# Patient Record
Sex: Female | Born: 1980 | Race: White | Hispanic: No | Marital: Married | Smoking: Former smoker
Health system: Southern US, Community
[De-identification: ages and names within clinical notes are randomized; demographics above are authoritative.]

## PROBLEM LIST (undated history)

## (undated) DIAGNOSIS — K219 Gastro-esophageal reflux disease without esophagitis: Secondary | ICD-10-CM

## (undated) DIAGNOSIS — Q2381 Bicuspid aortic valve: Secondary | ICD-10-CM

## (undated) DIAGNOSIS — Q231 Congenital insufficiency of aortic valve: Secondary | ICD-10-CM

## (undated) HISTORY — DX: Gastro-esophageal reflux disease without esophagitis: K21.9

---

## 2009-04-10 ENCOUNTER — Emergency Department (HOSPITAL_COMMUNITY): Admission: EM | Admit: 2009-04-10 | Discharge: 2009-04-10 | Payer: Self-pay | Admitting: Family Medicine

## 2009-09-30 ENCOUNTER — Emergency Department (HOSPITAL_COMMUNITY): Admission: EM | Admit: 2009-09-30 | Discharge: 2009-09-30 | Payer: Self-pay | Admitting: Family Medicine

## 2011-03-09 LAB — CULTURE, ROUTINE-ABSCESS

## 2014-09-30 ENCOUNTER — Other Ambulatory Visit: Payer: Self-pay | Admitting: Obstetrics & Gynecology

## 2014-09-30 ENCOUNTER — Other Ambulatory Visit (HOSPITAL_COMMUNITY)
Admission: RE | Admit: 2014-09-30 | Discharge: 2014-09-30 | Disposition: A | Discharge status: Home / Self Care | Payer: BC Managed Care – PPO | Source: Ambulatory Visit | Attending: Obstetrics & Gynecology | Admitting: Obstetrics & Gynecology

## 2014-09-30 DIAGNOSIS — Z1151 Encounter for screening for human papillomavirus (HPV): Secondary | ICD-10-CM | POA: Insufficient documentation

## 2014-09-30 DIAGNOSIS — Z01419 Encounter for gynecological examination (general) (routine) without abnormal findings: Secondary | ICD-10-CM | POA: Insufficient documentation

## 2014-10-01 LAB — CYTOLOGY - PAP

## 2015-11-30 HISTORY — PX: DIAGNOSTIC LAPAROSCOPY WITH REMOVAL OF ECTOPIC PREGNANCY: SHX6449

## 2018-01-13 ENCOUNTER — Ambulatory Visit (INDEPENDENT_AMBULATORY_CARE_PROVIDER_SITE_OTHER): Payer: Managed Care, Other (non HMO) | Admitting: Cardiology

## 2018-01-13 ENCOUNTER — Encounter: Payer: Self-pay | Admitting: Cardiology

## 2018-01-13 DIAGNOSIS — E785 Hyperlipidemia, unspecified: Secondary | ICD-10-CM

## 2018-01-13 DIAGNOSIS — R011 Cardiac murmur, unspecified: Secondary | ICD-10-CM

## 2018-01-13 NOTE — Progress Notes (Signed)
Cardiology Consultation:    Date:  01/13/2018   ID:  [name], DOB 1981-11-13, MRN [id number]  PCP:  Patient, No Pcp Per  Cardiologist:  Gypsy Balsam, MD   Referring MD: No ref. provider found   Chief Complaint  Patient presents with  . Heart Murmur  I have heart murmur  History of Present Illness:    Lisa Lewis is a 37 y.o. female who is being seen today for the evaluation of heart murmur at the request of No ref. provider found.  She was referred to [place] by gynecologist.  Apparently she is trying to get pregnant she had one miscarriage and one ectopic pregnancy.  During examination she was find to have heart murmur and she was referred to [place].  She was never aware of having any heart murmur.  There is no heart trouble.  She does not have hypertension no diabetes apparently her cholesterol is mildly elevated but otherwise she seems to be doing well.  There is no family history of heart disease.  She does not have any exercise intolerance.  She used to exercise on the regular basis however lately she is lack of and she is not doing it however just few days ago she tried to exercise again she did quite well.  There is no excessive shortness of breath, there is no swelling of lower extremities there is no palpitations no dizziness no passing out however couple days ago she was working very hard she did actually 30 steps that they already and she did not it did not doing very well and for a moment she failed dizziness she ended up drinking some water after that and she was fine.  Past Medical History:  Diagnosis Date  . Acid reflux       Current Medications: Current Meds  Medication Sig  . omeprazole (PRILOSEC) 20 MG capsule Take 20 mg by mouth daily.     Allergies:   Patient has no known allergies.   Social History   Socioeconomic History  . Marital status: Single    Spouse name: None  . Number of children: None  . Years of education: None  . Highest education level:  None  Social Needs  . Financial resource strain: None  . Food insecurity - worry: None  . Food insecurity - inability: None  . Transportation needs - medical: None  . Transportation needs - non-medical: None  Occupational History  . None  Tobacco Use  . Smoking status: Former Games developer  . Smokeless tobacco: Never Used  Substance and Sexual Activity  . Alcohol use: No    Frequency: Never  . Drug use: No  . Sexual activity: None  Other Topics Concern  . None  Social History Narrative  . None     Family History: The patient's family history is not on file. ROS:   Please see the history of present illness.    All 14 point review of systems negative except as described per history of present illness.  EKGs/Labs/Other Studies Reviewed:    The following studies were reviewed today:   EKG:  EKG is  ordered today.  The ekg ordered today demonstrates EKG showed normal sinus rhythm normal P interval normal QS complex duration morphology low voltage no ST segment abnormalities  Recent Labs: No results found for requested labs within last 8760 hours.  Recent Lipid Panel No results found for: CHOL, TRIG, HDL, CHOLHDL, VLDL, LDLCALC, LDLDIRECT  Physical Exam:    VS:  BP 110/62   Pulse 84   Ht 5\' 1"  (1.549 m)   Wt 223 lb 12.8 oz (101.5 kg)   SpO2 98%   BMI 42.29 kg/m     Wt Readings from Last 3 Encounters:  01/13/18 223 lb 12.8 oz (101.5 kg)     GEN:  Well nourished, well developed in no acute distress HEENT: Normal NECK: No JVD; No carotid bruits LYMPHATICS: No lymphadenopathy CARDIAC: RRR, soft systolic murmur grade 1/6 best heard at the left border of the sternum without radiation, no rubs, no gallops RESPIRATORY:  Clear to auscultation without rales, wheezing or rhonchi  ABDOMEN: Soft, non-tender, non-distended MUSCULOSKELETAL:  No edema; No deformity  SKIN: Warm and dry NEUROLOGIC:  Alert and oriented x 3 PSYCHIATRIC:  Normal affect   ASSESSMENT:    1. Heart  murmur   2. Dyslipidemia    PLAN:    In order of problems listed above:  1. Heart murmur very soft I doubt he is currently something really critical.  Echocardiogram will be done to assess valvular structures but honestly I do not anticipate to find anything dramatic here. 2. Dyslipidemia: We will try to get her fasting lipid profile from primary care physician.  We also started talking about good eating habits which she is already aware of.  Young lady referred to us for evaluation of heart murmur echocardiogram will be done we did talk about healthy lifestyle including exercises on the regular basis and eating right she is very much aware of this and she promised to comply with this advice is.  I see her back in my office in about 1 month I will do echocardiogram.   Medication Adjustments/Labs and Tests Ordered: Current medicines are reviewed at length with the patient today.  Concerns regarding medicines are outlined above.  No orders of the defined types were placed in this encounter.  No orders of the defined types were placed in this encounter.   Signed, Georgeanna Leaobert J. Maudry Zeidan, MD, [facility]. 01/13/2018 9:29 AM    [facility]

## 2018-01-13 NOTE — Patient Instructions (Addendum)
Medication Instructions:  Your physician recommends that you continue on your current medications as directed. Please refer to the Current Medication list given to you today.  Labwork: None ordered  Testing/Procedures: Your physician has requested that you have an echocardiogram. Echocardiography is a painless test that uses sound waves to create images of your heart. It provides your doctor with information about the size and shape of your heart and how well your heart's chambers and valves are working. This procedure takes approximately one hour. There are no restrictions for this procedure.  EKG Today  Follow-Up: Your physician recommends that you schedule a follow-up appointment in: 1 month with Dr. Krasowski   Any Other Special Instructions Will Be Listed Below (If Applicable).     If you need a refill on your cardiac medications before your next appointment, please call your pharmacy.   

## 2018-01-25 ENCOUNTER — Ambulatory Visit (HOSPITAL_BASED_OUTPATIENT_CLINIC_OR_DEPARTMENT_OTHER)
Admission: RE | Admit: 2018-01-25 | Discharge: 2018-01-25 | Disposition: A | Discharge status: Home / Self Care | Payer: Managed Care, Other (non HMO) | Source: Ambulatory Visit | Attending: Cardiology | Admitting: Cardiology

## 2018-01-25 DIAGNOSIS — R011 Cardiac murmur, unspecified: Secondary | ICD-10-CM | POA: Diagnosis present

## 2018-01-25 DIAGNOSIS — Q231 Congenital insufficiency of aortic valve: Secondary | ICD-10-CM | POA: Insufficient documentation

## 2018-01-25 DIAGNOSIS — E785 Hyperlipidemia, unspecified: Secondary | ICD-10-CM | POA: Diagnosis not present

## 2018-01-25 NOTE — Progress Notes (Signed)
Echocardiogram 2D Echocardiogram has been performed.  Dorothey BasemanReel, Lane Eland M 01/25/2018, 9:58 AM

## 2018-01-30 ENCOUNTER — Ambulatory Visit: Payer: Self-pay | Admitting: Cardiology

## 2018-02-07 ENCOUNTER — Other Ambulatory Visit (HOSPITAL_BASED_OUTPATIENT_CLINIC_OR_DEPARTMENT_OTHER): Payer: Managed Care, Other (non HMO)

## 2018-02-09 ENCOUNTER — Other Ambulatory Visit (HOSPITAL_BASED_OUTPATIENT_CLINIC_OR_DEPARTMENT_OTHER): Payer: Managed Care, Other (non HMO)

## 2018-02-10 ENCOUNTER — Ambulatory Visit: Payer: Managed Care, Other (non HMO) | Admitting: Cardiology

## 2018-02-14 ENCOUNTER — Encounter: Payer: Self-pay | Admitting: Cardiology

## 2018-02-14 ENCOUNTER — Ambulatory Visit (INDEPENDENT_AMBULATORY_CARE_PROVIDER_SITE_OTHER): Payer: Managed Care, Other (non HMO) | Admitting: Cardiology

## 2018-02-14 DIAGNOSIS — I7789 Other specified disorders of arteries and arterioles: Secondary | ICD-10-CM | POA: Diagnosis not present

## 2018-02-14 DIAGNOSIS — Q231 Congenital insufficiency of aortic valve: Secondary | ICD-10-CM | POA: Diagnosis not present

## 2018-02-14 NOTE — Progress Notes (Signed)
Cardiology Office Note:    Date:  02/14/2018   ID:  Lisa Lewis, DOB November 19, 1981, MRN [id number]  PCP:  Patient, No Pcp Per  Cardiologist:  Gypsy Balsam, MD    Referring MD: No ref. provider found   Chief Complaint  Patient presents with  . 1 month follow up  Doing well  History of Present Illness:    Lisa Lewis is a 37 y.o. female with heart murmur.  She did have echocardiogram echocardiogram showed bicuspid aortic valve is mildly enlarged aortic root.  There is no significant stenosis there was significant aortic insufficiency everything else was normal.  We spent a great amount of time talking about bicuspid aortic valve I told her that typically people with bicuspid aortic valve will require aortic valve replacement around age of 46-60.  She also has mild aortic root enlargement as expected in somebody with bicuspid aortic valve.  There is no other pathology to take identified.  In the future we may be forced to look at and her entire aorta.  But for now I have no indications of having any additional problems.  Good pulses distally.  Past Medical History:  Diagnosis Date  . Acid reflux       Current Medications: Current Meds  Medication Sig  . omeprazole (PRILOSEC) 20 MG capsule Take 20 mg by mouth daily.     Allergies:   Patient has no known allergies.   Social History   Socioeconomic History  . Marital status: Married    Spouse name: None  . Number of children: None  . Years of education: None  . Highest education level: None  Social Needs  . Financial resource strain: None  . Food insecurity - worry: None  . Food insecurity - inability: None  . Transportation needs - medical: None  . Transportation needs - non-medical: None  Occupational History  . None  Tobacco Use  . Smoking status: Former Games developer  . Smokeless tobacco: Never Used  Substance and Sexual Activity  . Alcohol use: No    Frequency: Never  . Drug use: No  . Sexual activity: None    Other Topics Concern  . None  Social History Narrative  . None     Family History: The patient's family history is not on file. ROS:   Please see the history of present illness.    All 14 point review of systems negative except as described per history of present illness  EKGs/Labs/Other Studies Reviewed:      Recent Labs: No results found for requested labs within last 8760 hours.  Recent Lipid Panel No results found for: CHOL, TRIG, HDL, CHOLHDL, VLDL, LDLCALC, LDLDIRECT  Physical Exam:    VS:  BP 104/64   Pulse 86   Ht 5\' 1"  (1.549 m)   Wt 230 lb 12.8 oz (104.7 kg)   SpO2 97%   BMI 43.61 kg/m     Wt Readings from Last 3 Encounters:  02/14/18 230 lb 12.8 oz (104.7 kg)  01/13/18 223 lb 12.8 oz (101.5 kg)     GEN:  Well nourished, well developed in no acute distress HEENT: Normal NECK: No JVD; No carotid bruits LYMPHATICS: No lymphadenopathy CARDIAC: RRR, systolic murmur grade 1/6 best heard at the right upper portion of the sternum., no rubs, no gallops RESPIRATORY:  Clear to auscultation without rales, wheezing or rhonchi  ABDOMEN: Soft, non-tender, non-distended MUSCULOSKELETAL:  No edema; No deformity  SKIN: Warm and dry LOWER EXTREMITIES: no swelling NEUROLOGIC:  Alert and oriented x 3 PSYCHIATRIC:  Normal affect   ASSESSMENT:    1. Bicuspid aortic valve   2. Aortic root enlargement (HCC)    PLAN:    In order of problems listed above:  1. Bicuspid aortic valve: Pathology explained to the patient.  I told her to stop much we can do to slow the progression of deterioration of the valve.  Also told her that probably when she will be 50-60 she will required aortic valve replacement.  Until then we will continue present management.  She will required yearly echocardiogram or sooner if she develops some symptoms. 2. Mild aortic root enlargement: We talked about avoiding heavy isovolumetric exercises.  Otherwise she can have perfectly normal life.  I told  her she does not require endocarditis prophylaxis.  She will back in my office in about 6 months or sooner if she has any problem.  I will put an order for fasting lipid profile but I want her to do it in about 1-2 months.  She started exercises on the regular basis I will wait for results of it before rechecking her cholesterol.   Medication Adjustments/Labs and Tests Ordered: Current medicines are reviewed at length with the patient today.  Concerns regarding medicines are outlined above.  No orders of the defined types were placed in this encounter.  Medication changes: No orders of the defined types were placed in this encounter.   Signed, Georgeanna Leaobert J. Krasowski, MD, [facility] 02/14/2018 8:31 AM    [facility]

## 2018-02-14 NOTE — Patient Instructions (Signed)
Medication Instructions:  Your physician recommends that you continue on your current medications as directed. Please refer to the Current Medication list given to you today.   Labwork: Your physician recommends that you return for lab work in 1-2 months: lipid panel.  Testing/Procedures: None  Follow-Up: Your physician wants you to follow-up in: 6 months. You will receive a reminder letter in the mail two months in advance. If you don't receive a letter, please call our office to schedule the follow-up appointment.   Any Other Special Instructions Will Be Listed Below (If Applicable).     If you need a refill on your cardiac medications before your next appointment, please call your pharmacy.

## 2018-02-17 ENCOUNTER — Telehealth: Payer: Self-pay | Admitting: *Deleted

## 2018-02-17 NOTE — Addendum Note (Signed)
Addended by: Crist FatLOCKHART, CATHERINE P on: 02/17/2018 10:20 AM   Modules accepted: Orders

## 2018-02-17 NOTE — Telephone Encounter (Signed)
Left message for patient to return call to update her. Dr. Bing MatterKrasowski decided to order a CT angio of chest aorta. Called to inform patient and have her come by the office for lab work prior to CT.

## 2018-02-21 LAB — BASIC METABOLIC PANEL
BUN / CREAT RATIO: 19 (ref 9–23)
BUN: 15 mg/dL (ref 6–20)
CHLORIDE: 103 mmol/L (ref 96–106)
CO2: 24 mmol/L (ref 20–29)
Calcium: 8.9 mg/dL (ref 8.7–10.2)
Creatinine, Ser: 0.78 mg/dL (ref 0.57–1.00)
GFR calc Af Amer: 114 mL/min/{1.73_m2} (ref >59)
GFR calc non Af Amer: 99 mL/min/{1.73_m2} (ref >59)
GLUCOSE: 129 mg/dL — AB (ref 65–99)
Potassium: 4.4 mmol/L (ref 3.5–5.2)
SODIUM: 139 mmol/L (ref 134–144)

## 2018-02-24 ENCOUNTER — Encounter (HOSPITAL_BASED_OUTPATIENT_CLINIC_OR_DEPARTMENT_OTHER): Payer: Self-pay

## 2018-02-24 ENCOUNTER — Ambulatory Visit (HOSPITAL_BASED_OUTPATIENT_CLINIC_OR_DEPARTMENT_OTHER)
Admission: RE | Admit: 2018-02-24 | Discharge: 2018-02-24 | Disposition: A | Discharge status: Home / Self Care | Payer: Managed Care, Other (non HMO) | Source: Ambulatory Visit | Attending: Cardiology | Admitting: Cardiology

## 2018-02-24 DIAGNOSIS — I7789 Other specified disorders of arteries and arterioles: Secondary | ICD-10-CM | POA: Insufficient documentation

## 2018-02-24 DIAGNOSIS — Q231 Congenital insufficiency of aortic valve: Secondary | ICD-10-CM | POA: Insufficient documentation

## 2018-02-24 IMAGING — CT CT ANGIO CHEST
2 of 9 series · 16 of 46 positions shown · IV contrast (APPLIED)
Comparison: None available

CLINICAL DATA: Asymptomatic bicuspid aortic valve, heart murmur

EXAM:
CT ANGIOGRAPHY CHEST WITH CONTRAST
TECHNIQUE: Multidetector CT imaging of the chest was performed using the
standard protocol during bolus administration of intravenous
contrast. Multiplanar CT image reconstructions and MIPs were
obtained to evaluate the vascular anatomy.
CONTRAST:  100mL [9M] IOPAMIDOL ([9M]) INJECTION 76%

[Series 5: axial arterial · axial · arterial · 0.79mm/px · z∈[-292,-52]mm · 13 of 94 slices shown]
[im 7/94  lung]
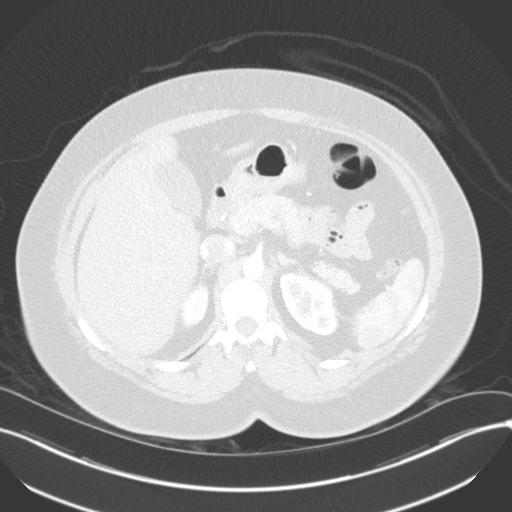
[im 14/94  soft-tissue]
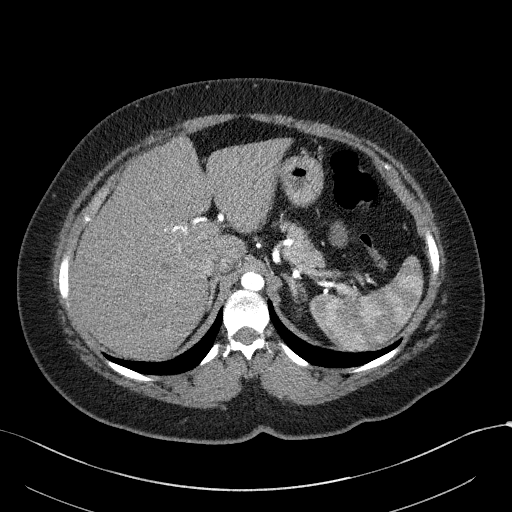
[im 20/94  lung]
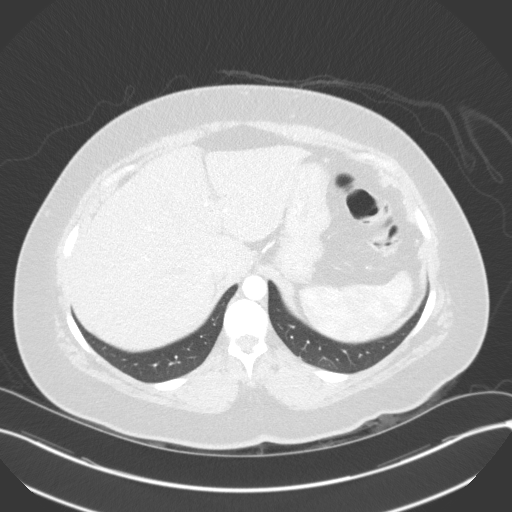
[im 27/94  soft-tissue]
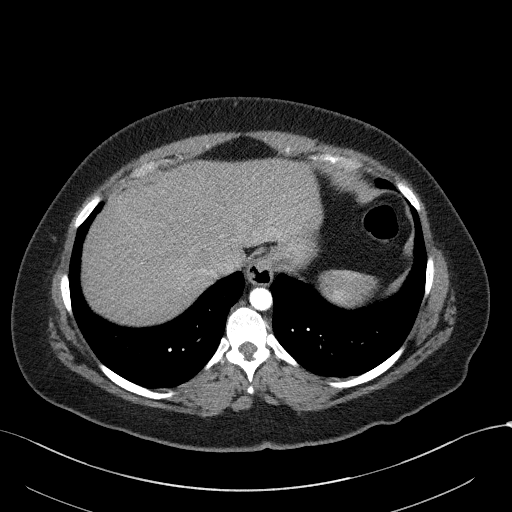
[im 34/94  lung]
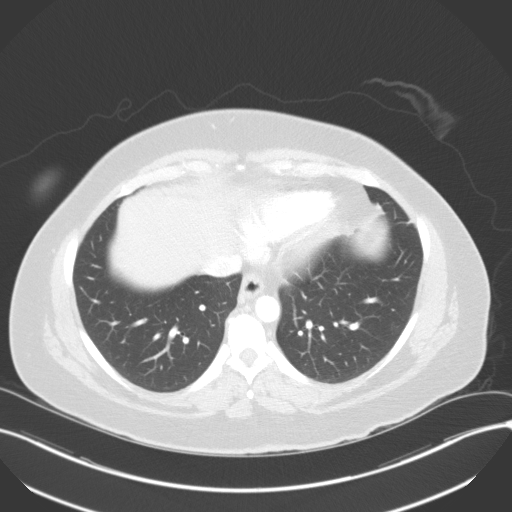
[im 40/94  soft-tissue]
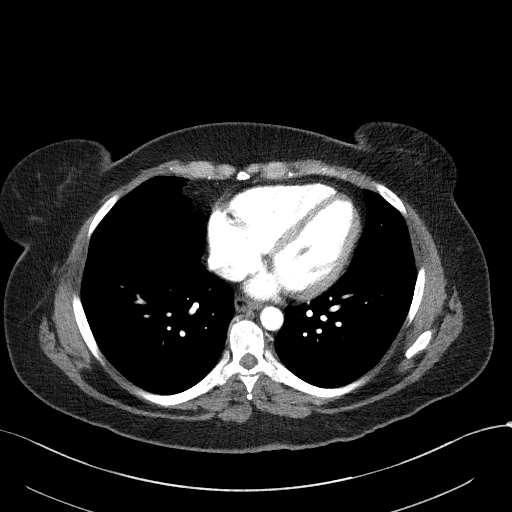
[im 47/94  lung]
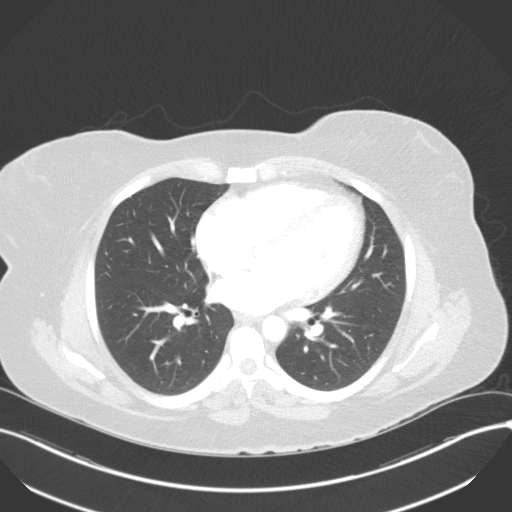
[im 54/94  soft-tissue]
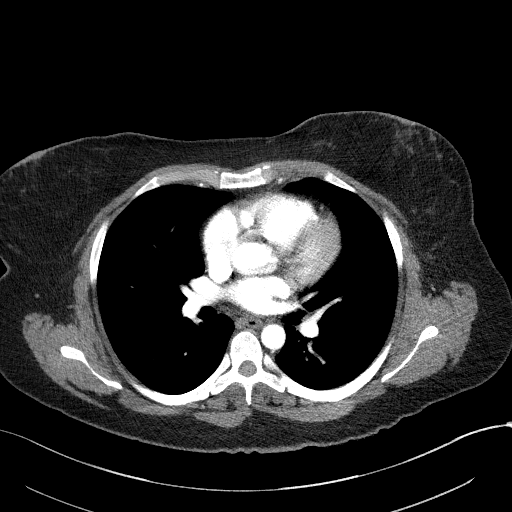
[im 60/94  lung]
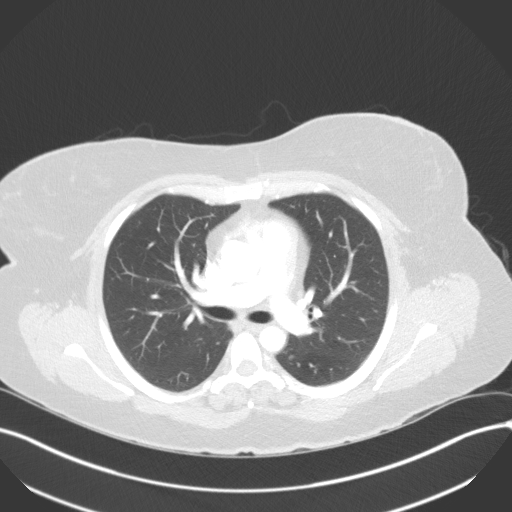
[im 67/94  soft-tissue]
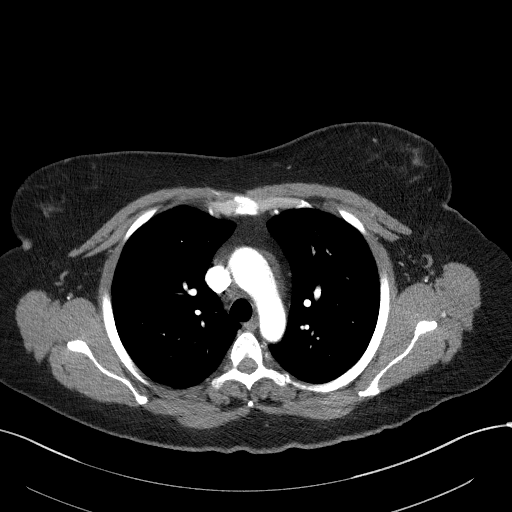
[im 74/94  lung]
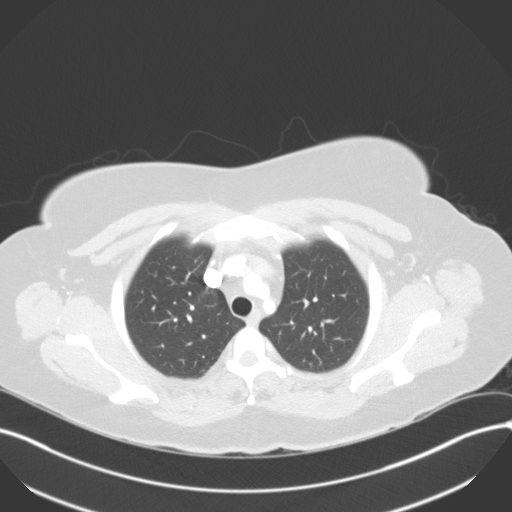
[im 80/94  soft-tissue]
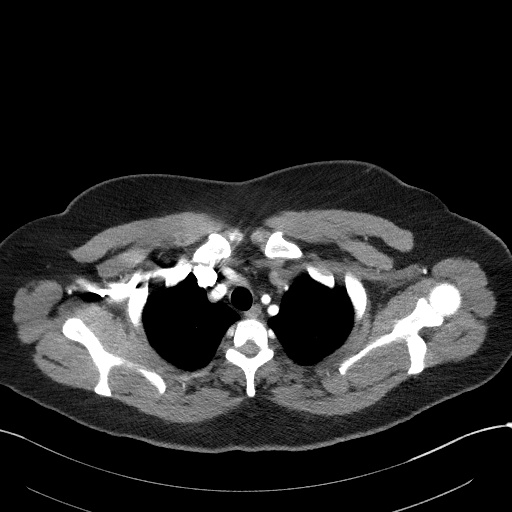
[im 87/94  lung]
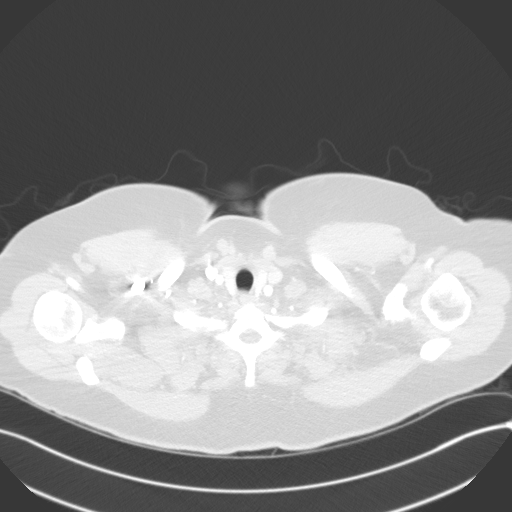

[Series 8: coronals · coronal · 0.58mm/px · 3 of 133 slices shown]
[im 34/133  soft-tissue]
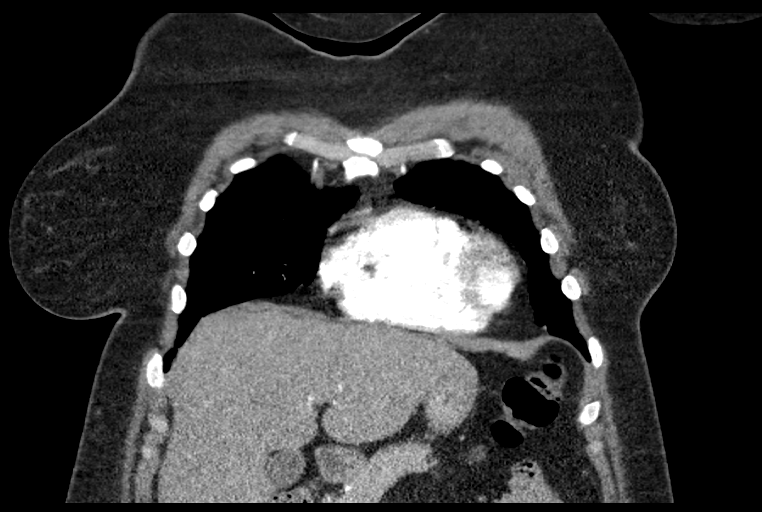
[im 67/133  soft-tissue]
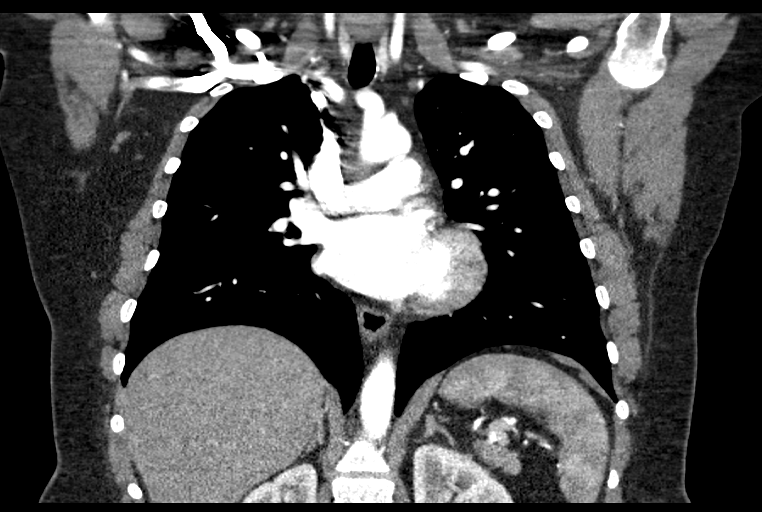
[im 100/133  soft-tissue]
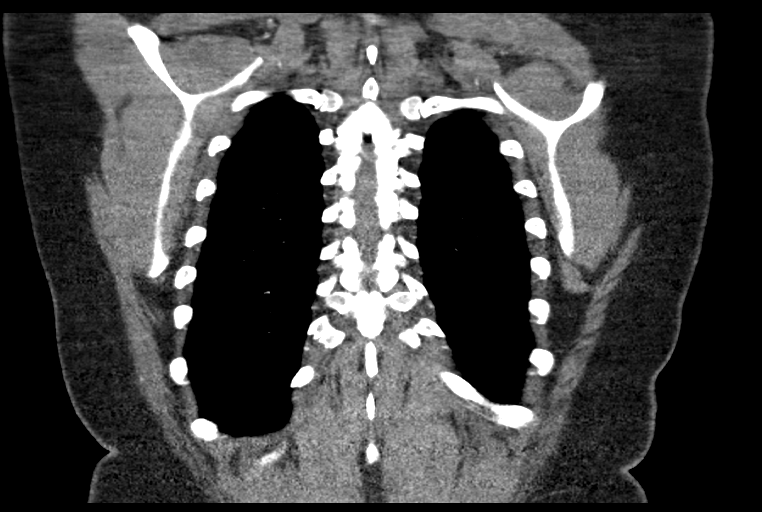

[16 of 46 positions shown; findings below may reference images not displayed]

FINDINGS: Cardiovascular: There is pulsation artifact across the proximal
ascending thoracic aorta creating artifact. This limits evaluation
of the ascending thoracic aorta. Maximal proximal ascending thoracic
aortic diameter 3.5 cm, image 37. Aortic valve is not calcified.
Patent 3 vessel arch anatomy. No significant aneurysm. Central
pulmonary arteries are patent.

Mediastinum/Nodes: No enlarged mediastinal, hilar, or axillary lymph
nodes. Thyroid gland, trachea, and esophagus demonstrate no
significant findings.

Lungs/Pleura: Lungs are clear. No pleural effusion or pneumothorax.

Upper Abdomen: No acute finding. Small hiatal hernia noted. No
biliary dilatation.

Musculoskeletal: Minor thoracic endplate bony spurring. No acute
osseous finding or compression fracture. Intact sternum.

Review of the MIP images confirms the above findings.
IMPRESSION: No acute intrathoracic finding.

No significant aneurysm. Maximal proximal ascending thoracic aortic
diameter 3.5 cm. Exam is limited with pulsation artifact of the
proximal thoracic aorta.

Recommend annual imaging followup by CTA or MRA. This recommendation
follows [9M] ACCF/AHA/AATS/ACR/ASA/SCA/KAZIBEK/KAZIBEK/KAZIBEK/KAZIBEK Guidelines
for the Diagnosis and Management of Patients with Thoracic Aortic
Disease. Circulation.[9M]; 121: [id number]

No central PE.

## 2018-02-24 MED ORDER — IOPAMIDOL (ISOVUE-370) INJECTION 76%
100.0000 mL | Freq: Once | INTRAVENOUS | Status: AC | PRN
  Administered 2018-02-24: 100 mL via INTRAVENOUS

## 2018-05-31 ENCOUNTER — Other Ambulatory Visit: Payer: Self-pay

## 2018-05-31 ENCOUNTER — Encounter (HOSPITAL_BASED_OUTPATIENT_CLINIC_OR_DEPARTMENT_OTHER): Payer: Self-pay

## 2018-05-31 ENCOUNTER — Emergency Department (HOSPITAL_BASED_OUTPATIENT_CLINIC_OR_DEPARTMENT_OTHER)
Admission: EM | Admit: 2018-05-31 | Discharge: 2018-05-31 | Disposition: A | Discharge status: Home / Self Care | Payer: Managed Care, Other (non HMO) | Attending: Emergency Medicine | Admitting: Emergency Medicine

## 2018-05-31 ENCOUNTER — Emergency Department (HOSPITAL_BASED_OUTPATIENT_CLINIC_OR_DEPARTMENT_OTHER)
Admission: EM | Admit: 2018-05-31 | Discharge: 2018-05-31 | Disposition: A | Discharge status: Home / Self Care | Payer: Managed Care, Other (non HMO) | Source: Home / Self Care | Attending: Emergency Medicine | Admitting: Emergency Medicine

## 2018-05-31 DIAGNOSIS — M542 Cervicalgia: Secondary | ICD-10-CM | POA: Insufficient documentation

## 2018-05-31 DIAGNOSIS — Q231 Congenital insufficiency of aortic valve: Secondary | ICD-10-CM | POA: Insufficient documentation

## 2018-05-31 DIAGNOSIS — Z87891 Personal history of nicotine dependence: Secondary | ICD-10-CM

## 2018-05-31 DIAGNOSIS — Z79899 Other long term (current) drug therapy: Secondary | ICD-10-CM | POA: Insufficient documentation

## 2018-05-31 DIAGNOSIS — M25552 Pain in left hip: Secondary | ICD-10-CM | POA: Insufficient documentation

## 2018-05-31 DIAGNOSIS — Y92410 Unspecified street and highway as the place of occurrence of the external cause: Secondary | ICD-10-CM

## 2018-05-31 DIAGNOSIS — Y999 Unspecified external cause status: Secondary | ICD-10-CM | POA: Insufficient documentation

## 2018-05-31 DIAGNOSIS — Y9389 Activity, other specified: Secondary | ICD-10-CM | POA: Insufficient documentation

## 2018-05-31 DIAGNOSIS — S300XXA Contusion of lower back and pelvis, initial encounter: Secondary | ICD-10-CM

## 2018-05-31 DIAGNOSIS — Z5321 Procedure and treatment not carried out due to patient leaving prior to being seen by health care provider: Secondary | ICD-10-CM | POA: Diagnosis not present

## 2018-05-31 HISTORY — DX: Congenital insufficiency of aortic valve: Q23.1

## 2018-05-31 HISTORY — DX: Bicuspid aortic valve: Q23.81

## 2018-05-31 MED ORDER — METHOCARBAMOL 500 MG PO TABS: 500.0000 mg | ORAL_TABLET | Freq: Three times a day (TID) | ORAL | 0 refills | Status: AC

## 2018-05-31 NOTE — ED Notes (Signed)
ED Provider at bedside. 
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
## 2018-05-31 NOTE — ED Notes (Signed)
Pt informed registration that she had an appointment to view a house and would come back tomorrow.
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
## 2018-05-31 NOTE — ED Notes (Signed)
Pt verbalizes understanding of d/c instructions and denies any further needs at this time. 
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
## 2018-09-19 ENCOUNTER — Ambulatory Visit (INDEPENDENT_AMBULATORY_CARE_PROVIDER_SITE_OTHER): Payer: Managed Care, Other (non HMO) | Admitting: Cardiology

## 2018-09-19 ENCOUNTER — Encounter: Payer: Self-pay | Admitting: Cardiology

## 2018-09-19 VITALS — BP 130/80 | HR 87 | Ht 61.0 in | Wt 236.8 lb

## 2018-09-19 DIAGNOSIS — I7789 Other specified disorders of arteries and arterioles: Secondary | ICD-10-CM

## 2018-09-19 DIAGNOSIS — Q231 Congenital insufficiency of aortic valve: Secondary | ICD-10-CM | POA: Diagnosis not present

## 2018-09-19 DIAGNOSIS — E785 Hyperlipidemia, unspecified: Secondary | ICD-10-CM

## 2018-09-19 NOTE — Patient Instructions (Signed)
Medication Instructions:  Your physician recommends that you continue on your current medications as directed. Please refer to the Current Medication list given to you today.  If you need a refill on your cardiac medications before your next appointment, please call your pharmacy.   Lab work: Your physician recommends that you return for lab work this week (fasting): lipids, and lft.  If you have labs (blood work) drawn today and your tests are completely normal, you will receive your results only by: Marland Kitchen MyChart Message (if you have MyChart) OR . A paper copy in the mail If you have any lab test that is abnormal or we need to change your treatment, we will call you to review the results.  Testing/Procedures: Non-Cardiac CT scanning, (CAT scanning), is a noninvasive, special x-ray that produces cross-sectional images of the body using x-rays and a computer. CT scans help physicians diagnose and treat medical conditions. For some CT exams, a contrast material is used to enhance visibility in the area of the body being studied. CT scans provide greater clarity and reveal more details than regular x-ray exams.    Follow-Up: At [facility], you and your health needs are our priority.  As part of our continuing mission to provide you with exceptional heart care, we have created designated Provider Care Teams.  These Care Teams include your primary Cardiologist (physician) and Advanced Practice Providers (APPs -  Physician Assistants and Nurse Practitioners) who all work together to provide you with the care you need, when you need it. You will need a follow up appointment in 6 months.  Please call our office 2 months in advance to schedule this appointment.  You may see No primary care provider on file. or another member of our [employer] Provider Team in [place]: Norman Herrlich, MD . Belva Crome, MD  Any Other Special Instructions Will Be Listed Below (If Applicable).

## 2018-09-19 NOTE — Progress Notes (Signed)
Cardiology Office Note:    Date:  09/19/2018   ID:  [name], DOB 1981-05-09, MRN [id number]  PCP:  Patient, No Pcp Per  Cardiologist:  Gypsy Balsam, MD    Referring MD: No ref. provider found   Chief Complaint  Patient presents with  . Follow-up    Episode Sunday, sounded like she had fluid in her lungs, vomited mucus  I had episode on Sunday when I was gargling my lungs  History of Present Illness:    Lisa Lewis is a 37 y.o. female with bicuspid aortic valve also ascending aortic aneurysm only mild measuring 3.5 cm overall doing well but she woke up at 4:00 in the morning on Sunday with gurgling-like sensation she eventually ended up going to the restroom and to a few times and after that she went to urgent care.  Nothing was fine after that.  Since that time she is doing well no chest pain tightness squeezing pressure mentions no shortness of breath.  Past Medical History:  Diagnosis Date  . Acid reflux   . Bicuspid aortic valve     No past surgical history on file.  Current Medications: Current Meds  Medication Sig  . omeprazole (PRILOSEC) 20 MG capsule Take 20 mg by mouth daily.     Allergies:   Patient has no known allergies.   Social History   Socioeconomic History  . Marital status: Married    Spouse name: Not on file  . Number of children: Not on file  . Years of education: Not on file  . Highest education level: Not on file  Occupational History  . Not on file  Social Needs  . Financial resource strain: Not on file  . Food insecurity:    Worry: Not on file    Inability: Not on file  . Transportation needs:    Medical: Not on file    Non-medical: Not on file  Tobacco Use  . Smoking status: Former Games developer  . Smokeless tobacco: Never Used  Substance and Sexual Activity  . Alcohol use: No    Frequency: Never  . Drug use: No  . Sexual activity: Not on file  Lifestyle  . Physical activity:    Days per week: Not on file    Minutes per  session: Not on file  . Stress: Not on file  Relationships  . Social connections:    Talks on phone: Not on file    Gets together: Not on file    Attends religious service: Not on file    Active member of club or organization: Not on file    Attends meetings of clubs or organizations: Not on file    Relationship status: Not on file  Other Topics Concern  . Not on file  Social History Narrative  . Not on file     Family History: The patient's Family history is unknown by patient. ROS:   Please see the history of present illness.    All 14 point review of systems negative except as described per history of present illness  EKGs/Labs/Other Studies Reviewed:      Recent Labs: 02/20/2018: BUN 15; Creatinine, Ser 0.78; Potassium 4.4; Sodium 139  Recent Lipid Panel No results found for: CHOL, TRIG, HDL, CHOLHDL, VLDL, LDLCALC, LDLDIRECT  Physical Exam:    VS:  BP 130/80   Pulse 87   Ht 5\' 1"  (1.549 m)   Wt 236 lb 12.8 oz (107.4 kg)   SpO2 98%   BMI  44.74 kg/m     Wt Readings from Last 3 Encounters:  09/19/18 236 lb 12.8 oz (107.4 kg)  05/31/18 228 lb 12.8 oz (103.8 kg)  05/31/18 228 lb 9.9 oz (103.7 kg)     GEN:  Well nourished, well developed in no acute distress HEENT: Normal NECK: No JVD; No carotid bruits LYMPHATICS: No lymphadenopathy CARDIAC: RRR, soft diasystolic murmur grade 1/6, no rubs, no gallops RESPIRATORY:  Clear to auscultation without rales, wheezing or rhonchi  ABDOMEN: Soft, non-tender, non-distended MUSCULOSKELETAL:  No edema; No deformity  SKIN: Warm and dry LOWER EXTREMITIES: no swelling NEUROLOGIC:  Alert and oriented x 3 PSYCHIATRIC:  Normal affect   ASSESSMENT:    1. Bicuspid aortic valve   2. Aortic root enlargement (HCC)   3. Dyslipidemia    PLAN:    In order of problems listed above:  1. Bicuspid aortic valve.  Doing well from that point review we will plan another echocardiogram symptoms in spring time. 2. Aortic root  enlargement only mild.  We will repeat CT in March of next year 3. Dyslipidemia we will check a fasting lipid profile.   Medication Adjustments/Labs and Tests Ordered: Current medicines are reviewed at length with the patient today.  Concerns regarding medicines are outlined above.  No orders of the defined types were placed in this encounter.  Medication changes: No orders of the defined types were placed in this encounter.   Signed, Georgeanna Lea, MD, [facility] 09/19/2018 4:06 PM    [facility]

## 2018-09-30 LAB — HEPATIC FUNCTION PANEL
ALK PHOS: 76 IU/L (ref 39–117)
ALT: 29 IU/L (ref 0–32)
AST: 20 IU/L (ref 0–40)
Albumin: 4 g/dL (ref 3.5–5.5)
Bilirubin Total: 0.3 mg/dL (ref 0.0–1.2)
Bilirubin, Direct: 0.1 mg/dL (ref 0.00–0.40)
Total Protein: 6.7 g/dL (ref 6.0–8.5)

## 2018-09-30 LAB — LIPID PANEL
Chol/HDL Ratio: 3.6 ratio (ref 0.0–4.4)
Cholesterol, Total: 196 mg/dL (ref 100–199)
HDL: 55 mg/dL (ref >39)
LDL Calculated: 122 mg/dL — ABNORMAL HIGH (ref 0–99)
TRIGLYCERIDES: 96 mg/dL (ref 0–149)
VLDL Cholesterol Cal: 19 mg/dL (ref 5–40)

## 2018-10-05 ENCOUNTER — Other Ambulatory Visit: Payer: Self-pay | Admitting: Obstetrics and Gynecology

## 2018-10-05 DIAGNOSIS — N912 Amenorrhea, unspecified: Secondary | ICD-10-CM

## 2018-10-05 DIAGNOSIS — R140 Abdominal distension (gaseous): Secondary | ICD-10-CM

## 2018-10-06 ENCOUNTER — Telehealth: Payer: Self-pay | Admitting: Emergency Medicine

## 2018-10-06 NOTE — Telephone Encounter (Signed)
Left message for patient to return call regarding lab results.  

## 2018-10-10 ENCOUNTER — Other Ambulatory Visit: Payer: Managed Care, Other (non HMO)

## 2018-10-21 ENCOUNTER — Encounter: Payer: Self-pay | Admitting: Radiology

## 2018-10-21 ENCOUNTER — Ambulatory Visit
Admission: RE | Admit: 2018-10-21 | Discharge: 2018-10-21 | Disposition: A | Discharge status: Home / Self Care | Payer: Managed Care, Other (non HMO) | Source: Ambulatory Visit | Attending: Obstetrics and Gynecology | Admitting: Obstetrics and Gynecology

## 2018-10-21 DIAGNOSIS — N912 Amenorrhea, unspecified: Secondary | ICD-10-CM

## 2018-10-21 DIAGNOSIS — R140 Abdominal distension (gaseous): Secondary | ICD-10-CM

## 2018-10-21 MED ORDER — IOPAMIDOL (ISOVUE-300) INJECTION 61%
100.0000 mL | Freq: Once | INTRAVENOUS | Status: AC | PRN
  Administered 2018-10-21: 100 mL via INTRAVENOUS

## 2019-02-28 ENCOUNTER — Telehealth: Payer: Self-pay | Admitting: Cardiology

## 2019-02-28 NOTE — Telephone Encounter (Signed)
Please advise. Thanks.  

## 2019-02-28 NOTE — Telephone Encounter (Signed)
Patient informed per Dr. Bing Matter that her heart condition doesn't put her at high risk for COVID 19. However we still advised her to follow social distancing per CDC guidelines. She reports she is due for a 6 month follow up however she denied a televisit. She will call us when she is more comfortable to come in the office after the pandemic has calmed down.

## 2019-02-28 NOTE — Telephone Encounter (Signed)
New Message:    Pt wants to know if her heart condition is effected by this virus?

## 2019-04-24 ENCOUNTER — Telehealth: Payer: Self-pay | Admitting: Emergency Medicine

## 2019-04-24 DIAGNOSIS — Q231 Congenital insufficiency of aortic valve: Secondary | ICD-10-CM

## 2019-04-24 NOTE — Telephone Encounter (Signed)
Informed patient via mychart she will need an a echocardiogram. Advised patient to call to scheduled this, order has been placed.

## 2019-05-08 ENCOUNTER — Other Ambulatory Visit: Payer: Self-pay

## 2019-05-08 ENCOUNTER — Ambulatory Visit (HOSPITAL_BASED_OUTPATIENT_CLINIC_OR_DEPARTMENT_OTHER)
Admission: RE | Admit: 2019-05-08 | Discharge: 2019-05-08 | Disposition: A | Discharge status: Home / Self Care | Payer: Managed Care, Other (non HMO) | Source: Ambulatory Visit | Attending: Cardiology | Admitting: Cardiology

## 2019-05-08 DIAGNOSIS — Q231 Congenital insufficiency of aortic valve: Secondary | ICD-10-CM

## 2019-05-08 NOTE — Progress Notes (Signed)
  Echocardiogram 2D Echocardiogram has been performed.  Lisa Lewis 05/08/2019, 11:05 AM

## 2019-05-09 ENCOUNTER — Telehealth: Payer: Self-pay | Admitting: Emergency Medicine

## 2019-05-09 NOTE — Telephone Encounter (Signed)
Left results of echo on patients voicemail per dpr and advised her to call back with any questions.

## 2019-05-11 ENCOUNTER — Telehealth: Payer: Self-pay | Admitting: Cardiology

## 2019-05-11 NOTE — Telephone Encounter (Signed)
NEW MESSAGE  Per pt call  - stated she has some questions about her ECHO  Results done on 6/9.  Please give her a call back.

## 2019-05-14 NOTE — Telephone Encounter (Signed)
Called patient and she has questions regarding echo results due to her echo last year stating her aortic valve was bicuspid and this years states it is tricuspid. She wants clarification on this. Will consult with Dr. Agustin Cree.

## 2019-05-24 ENCOUNTER — Other Ambulatory Visit: Payer: Self-pay

## 2019-05-24 ENCOUNTER — Telehealth (INDEPENDENT_AMBULATORY_CARE_PROVIDER_SITE_OTHER): Payer: Managed Care, Other (non HMO) | Admitting: Cardiology

## 2019-05-24 ENCOUNTER — Encounter: Payer: Self-pay | Admitting: Cardiology

## 2019-05-24 DIAGNOSIS — E785 Hyperlipidemia, unspecified: Secondary | ICD-10-CM

## 2019-05-24 DIAGNOSIS — I7789 Other specified disorders of arteries and arterioles: Secondary | ICD-10-CM | POA: Diagnosis not present

## 2019-05-24 DIAGNOSIS — Q231 Congenital insufficiency of aortic valve: Secondary | ICD-10-CM

## 2019-05-24 DIAGNOSIS — R011 Cardiac murmur, unspecified: Secondary | ICD-10-CM | POA: Diagnosis not present

## 2019-05-24 NOTE — Patient Instructions (Signed)
Medication Instructions:  Your physician recommends that you continue on your current medications as directed. Please refer to the Current Medication list given to you today.  If you need a refill on your cardiac medications before your next appointment, please call your pharmacy.   Lab work: None.  If you have labs (blood work) drawn today and your tests are completely normal, you will receive your results only by: . MyChart Message (if you have MyChart) OR . A paper copy in the mail If you have any lab test that is abnormal or we need to change your treatment, we will call you to review the results.  Testing/Procedures: None.   Follow-Up: At [facility], you and your health needs are our priority.  As part of our continuing mission to provide you with exceptional heart care, we have created designated Provider Care Teams.  These Care Teams include your primary Cardiologist (physician) and Advanced Practice Providers (APPs -  Physician Assistants and Nurse Practitioners) who all work together to provide you with the care you need, when you need it. You will need a follow up appointment in 1 years.  Please call our office 2 months in advance to schedule this appointment.  You may see No primary care provider on file. or another member of our [facility] Provider Team in [place]: Brian Munley, MD . Rajan Revankar, MD  Any Other Special Instructions Will Be Listed Below (If Applicable).     

## 2019-05-24 NOTE — Progress Notes (Signed)
Virtual Visit via Video Note   This visit type was conducted due to national recommendations for restrictions regarding the COVID-19 Pandemic (e.g. social distancing) in an effort to limit this patient's exposure and mitigate transmission in our community.  Due to her co-morbid illnesses, this patient is at least at moderate risk for complications without adequate follow up.  This format is felt to be most appropriate for this patient at this time.  All issues noted in this document were discussed and addressed.  A limited physical exam was performed with this format.  Please refer to the patient's chart for her consent to telehealth for [facility].  Evaluation Performed:  Follow-up visit  This visit type was conducted due to national recommendations for restrictions regarding the COVID-19 Pandemic (e.g. social distancing).  This format is felt to be most appropriate for this patient at this time.  All issues noted in this document were discussed and addressed.  No physical exam was performed (except for noted visual exam findings with Video Visits).  Please refer to the patient's chart (MyChart message for video visits and phone note for telephone visits) for the patient's consent to telehealth for [facility].  Date:  05/24/2019  ID: Lisa Lewis, DOB 08/19/1981, MRN [id number]   Patient Location: [address] [address] [place] [id number]   Provider location:   [place] Office  PCP:  Cathlean Sauer, MD  Cardiologist:  Jenne Campus, MD     Chief Complaint: Doing very well  History of Present Illness:    Lisa Lewis is a 38 y.o. female  who presents via audio/video conferencing for a telehealth visit today.  Past medical history significant for bicuspid aortic valve discovered on echocardiogram, only mild aortic insufficiency, no aortic stenosis, also history of stenting aortic enlargement measuring 34 mm based on last echocardiogram does have video  visit with me today to talk about her issues.  She had repeated echocardiogram repeated echocardiogram did not mention anything about bicuspid aortic valve is a tricuspid aortic valve.  A look at this echocardiogram myself and is difficult to determine I suspect what we dealing with is partial bicuspid aortic valve.  Luckily her aortic root size is the same.  She is trying to get pregnant and she would like to make sure everything is okay from heart point of view overall doing well no chest pain tightness squeezing pressure been chest no shortness of breath no palpitations.   The patient does not have symptoms concerning for COVID-19 infection (fever, chills, cough, or new SHORTNESS OF BREATH).    Prior CV studies:   The following studies were reviewed today:  Echocardiogram done on 05/08/1999   1. The left ventricle has normal systolic function with an ejection fraction of 60-65%. The cavity size was normal. Left ventricular diastolic parameters were normal.  2. The right ventricle has normal systolic function. The cavity was normal. There is no increase in right ventricular wall thickness.  3. No evidence of mitral valve stenosis.  4. The aortic valve is tricuspid. Aortic valve regurgitation is mild by color flow Doppler. The jet is posteriorly-directed.  5. The aortic arch and aortic root are normal in size and structure.  6. There is mild dilatation of the ascending aorta measuring 34 mm     Past Medical History:  Diagnosis Date  . Acid reflux   . Bicuspid aortic valve     No past surgical history on file.   No  outpatient medications have been marked as taking for the 05/24/19 encounter (Telemedicine) with Georgeanna LeaKrasowski, Robert J, MD.      Family History: The patient's Family history is unknown by patient.   ROS:   Please see the history of present illness.     All other systems reviewed and are negative.   Labs/Other Tests and Data Reviewed:     Recent Labs: 09/29/2018:  ALT 29  Recent Lipid Panel    Component Value Date/Time   CHOL 196 09/29/2018 0840   TRIG 96 09/29/2018 0840   HDL 55 09/29/2018 0840   CHOLHDL 3.6 09/29/2018 0840   LDLCALC 122 (H) 09/29/2018 0840      Exam:    Vital Signs:  There were no vitals taken for this visit.    Wt Readings from Last 3 Encounters:  09/19/18 236 lb 12.8 oz (107.4 kg)  05/31/18 228 lb 12.8 oz (103.8 kg)  05/31/18 228 lb 9.9 oz (103.7 kg)     Well nourished, well developed in no acute distress. Alert oriented x3 talking to me a video link asymptomatic cheerful and happy to be able to talk to me  Diagnosis for this visit:   1. Bicuspid aortic valve   2. Heart murmur   3. Dyslipidemia   4. Aortic root enlargement (HCC)      ASSESSMENT & PLAN:    1.  Bicuspid aortic valve however last echocardiogram did not confirm that.  I looked at both echocardiogram the one from a year ago and now and I suspect we dealing with incomplete bicuspid aortic valve.  I think there is partial raphae between the 2 leaflets.  Overall she is asymptomatic and hemodynamically the lesion is insignificant.  She only got mild aortic insufficiency. 2.  Enlargement of the aorta.  Only 34 mm last year 35.  Stable CT of abdomen and chest being done to check anti-or 10 there is no coarctation there is no other location of dilatation. 3.  Dyslipidemia doing well from that point review continue present management.  Overall I he is doing great she should have no problem during the pregnancy.  Obviously we need to pay attention to her blood pressure.  If she develop preeclampsia need to be treated aggressively and appropriately.  Luckily she does not have any gradient across aortic valve.  Therefore she considered low risk from that point of view.  COVID-19 Education: The signs and symptoms of COVID-19 were discussed with the patient and how to seek care for testing (follow up with PCP or arrange E-visit).  The importance of social  distancing was discussed today.  Patient Risk:   After full review of this patients clinical status, I feel that they are at least moderate risk at this time.  Time:   Today, I have spent 16 minutes with the patient with telehealth technology discussing pt health issues.  I spent 5minutes reviewing her chart before the visit.  Visit was finished at 2:08 PM.    Medication Adjustments/Labs and Tests Ordered: Current medicines are reviewed at length with the patient today.  Concerns regarding medicines are outlined above.  No orders of the defined types were placed in this encounter.  Medication changes: No orders of the defined types were placed in this encounter.    Disposition: Follow-up 1 year  Signed, Georgeanna Leaobert J. Krasowski, MD, [facility] 05/24/2019 2:03 PM    [facility]

## 2020-12-30 LAB — OB RESULTS CONSOLE HEPATITIS B SURFACE ANTIGEN: Hepatitis B Surface Ag: NEGATIVE

## 2020-12-30 LAB — OB RESULTS CONSOLE RUBELLA ANTIBODY, IGM: Rubella: IMMUNE

## 2020-12-30 LAB — OB RESULTS CONSOLE GC/CHLAMYDIA
Chlamydia: NEGATIVE
Gonorrhea: NEGATIVE

## 2020-12-30 LAB — OB RESULTS CONSOLE HIV ANTIBODY (ROUTINE TESTING): HIV: NONREACTIVE

## 2021-05-05 ENCOUNTER — Other Ambulatory Visit: Payer: Self-pay

## 2021-05-05 ENCOUNTER — Inpatient Hospital Stay (HOSPITAL_COMMUNITY)
Admission: AD | Admit: 2021-05-05 | Discharge: 2021-05-05 | Disposition: A | Discharge status: Home / Self Care | Payer: BC Managed Care – PPO | Attending: Obstetrics and Gynecology | Admitting: Obstetrics and Gynecology

## 2021-05-05 ENCOUNTER — Encounter (HOSPITAL_COMMUNITY): Payer: Self-pay | Admitting: Obstetrics and Gynecology

## 2021-05-05 DIAGNOSIS — O09523 Supervision of elderly multigravida, third trimester: Secondary | ICD-10-CM | POA: Insufficient documentation

## 2021-05-05 DIAGNOSIS — Z87891 Personal history of nicotine dependence: Secondary | ICD-10-CM | POA: Diagnosis not present

## 2021-05-05 DIAGNOSIS — Z3A30 30 weeks gestation of pregnancy: Secondary | ICD-10-CM | POA: Diagnosis not present

## 2021-05-05 DIAGNOSIS — O368130 Decreased fetal movements, third trimester, not applicable or unspecified: Secondary | ICD-10-CM | POA: Diagnosis not present

## 2021-05-05 DIAGNOSIS — Z79899 Other long term (current) drug therapy: Secondary | ICD-10-CM | POA: Diagnosis not present

## 2021-05-05 DIAGNOSIS — Z3689 Encounter for other specified antenatal screening: Secondary | ICD-10-CM

## 2021-05-05 LAB — URINALYSIS, ROUTINE W REFLEX MICROSCOPIC
Bilirubin Urine: NEGATIVE
Glucose, UA: 50 mg/dL — AB
Hgb urine dipstick: NEGATIVE
Ketones, ur: NEGATIVE mg/dL
Leukocytes,Ua: NEGATIVE
Nitrite: NEGATIVE
Protein, ur: NEGATIVE mg/dL
Specific Gravity, Urine: 1.005 (ref 1.005–1.030)
pH: 7 (ref 5.0–8.0)

## 2021-05-05 NOTE — MAU Provider Note (Signed)
History     CSN: [id number]  Arrival date and time: 05/05/21 2119   Event Date/Time   First Provider Initiated Contact with Patient 05/05/21 1020      Chief Complaint  Patient presents with  . Decreased Fetal Movement   Ms. Lisa Lewis is a 40 y.o. year old [id number] female at [redacted]w[redacted]d weeks gestation who presents to MAU reporting DFM since yesterday 05/04/21 at about 0630. She reports having FM "Here & there" throughout the day and has been feeling FM this morning. She states, "I don't have anything to compare it to. I was told I am supposed to feel 10 FM in 2 hrs. I don't know, I'm probably just paranoid." She denies any pain, VB or LOF. She receives her [facility] with [facility]; next appt is on 05/11/21.   OB History    Gravida  3   Para      Term      Preterm      AB  2   Living        SAB  1   IAB      Ectopic  1   Multiple      Live Births              Past Medical History:  Diagnosis Date  . Acid reflux   . Bicuspid aortic valve     Past Surgical History:  Procedure Laterality Date  . DIAGNOSTIC LAPAROSCOPY WITH REMOVAL OF ECTOPIC PREGNANCY      Family History  Family history unknown: Yes    Social History   Tobacco Use  . Smoking status: Former Games developer  . Smokeless tobacco: Never Used  Vaping Use  . Vaping Use: Never used  Substance Use Topics  . Alcohol use: No  . Drug use: No    Allergies: No Known Allergies  Medications Prior to Admission  Medication Sig Dispense Refill Last Dose  . omeprazole (PRILOSEC) 20 MG capsule Take 20 mg by mouth daily.   05/05/2021 at Unknown time  . Prenatal Vit-Fe Fumarate-FA (PRENATAL MULTIVITAMIN) TABS tablet Take 1 tablet by mouth daily at 12 noon.   05/04/2021 at Unknown time    Review of Systems  Constitutional: Negative.   HENT: Negative.   Eyes: Negative.   Respiratory: Negative.   Cardiovascular: Negative.   Gastrointestinal: Negative.   Endocrine: Negative.   Genitourinary:       DFM since yesterday @  0630  Musculoskeletal: Negative.   Skin: Negative.   Allergic/Immunologic: Negative.   Neurological: Negative.   Hematological: Negative.   Psychiatric/Behavioral: Negative.    Physical Exam   Blood pressure 122/82, pulse 80, temperature 98 F (36.7 C), temperature source Oral, resp. rate 16, SpO2 100 %.  Physical Exam Vitals and nursing note reviewed. Exam conducted with a chaperone present.  Constitutional:      Appearance: Normal appearance. She is obese.  Cardiovascular:     Rate and Rhythm: Normal rate.  Pulmonary:     Effort: Pulmonary effort is normal.  Abdominal:     Palpations: Abdomen is soft.  Genitourinary:    General: Normal vulva.     Comments: Dilation: Closed Effacement (%): Thick Exam by:: Raelyn Mora, CNM  Skin:    General: Skin is warm and dry.  Neurological:     Mental Status: She is alert and oriented to person, place, and time.  Psychiatric:        Mood and Affect: Mood normal.  Behavior: Behavior normal.        Thought Content: Thought content normal.        Judgment: Judgment normal.    REACTIVE NST - FHR: 140 bpm / moderate variability / accels present / decels absent / TOCO: irregular every 2-6 mins   Cervical Recheck @ 1200: unchanged MAU Course  Procedures  MDM fFN collected, but discarded in the setting of cervix being closed/thick/high  Results for orders placed or performed during the hospital encounter of 05/05/21 (from the past 24 hour(s))  Urinalysis, Routine w reflex microscopic Urine, Clean Catch     Status: Abnormal   Collection Time: 05/05/21  9:42 AM  Result Value Ref Range   Color, Urine YELLOW YELLOW   APPearance HAZY (A) CLEAR   Specific Gravity, Urine 1.005 1.005 - 1.030   pH 7.0 5.0 - 8.0   Glucose, UA 50 (A) NEGATIVE mg/dL   Hgb urine dipstick NEGATIVE NEGATIVE   Bilirubin Urine NEGATIVE NEGATIVE   Ketones, ur NEGATIVE NEGATIVE mg/dL   Protein, ur NEGATIVE NEGATIVE mg/dL   Nitrite NEGATIVE NEGATIVE    Leukocytes,Ua NEGATIVE NEGATIVE    Assessment and Plan  NST (non-stress test) reactive  - Discussed normal NST today - Information provided on [facility]   [redacted] weeks gestation of pregnancy   - Discharge patient - Keep scheduled appt with [facility] on 05/11/21 - Patient verbalized an understanding of the plan of care and agrees.   Raelyn Mora, CNM 05/05/2021, 10:20 AM

## 2021-05-05 NOTE — Discharge Instructions (Signed)
The monitoring today shows that you r baby is moving a lot. We are reassured that your baby's well-being is normal at the time of your visit.

## 2021-05-05 NOTE — MAU Note (Signed)
Decrease in FM noted yesterday, seems to be moving more today.

## 2021-05-05 NOTE — MAU Note (Signed)
A total of 24 fetal kick counts recorded via patient clicker over 2 hours.

## 2021-05-05 NOTE — MAU Note (Signed)
...  Lisa Lewis is a 40 y.o. at [redacted]w[redacted]d here in MAU reporting: decreased fetal movement that began yesterday morning. Patient states she felt the baby move "here and there" throughout the day and has been feeling the baby move this morning. Patient states "I don't have anything to compare it to. They told me I was supposed to feel 10 movements in two hours. I don't know, I'm probably just paranoid." No VB or LOF.   Lab orders placed from triage: UA

## 2021-06-12 LAB — OB RESULTS CONSOLE GBS: GBS: NEGATIVE

## 2021-06-25 ENCOUNTER — Encounter (HOSPITAL_COMMUNITY): Payer: Self-pay | Admitting: Obstetrics and Gynecology

## 2021-06-25 ENCOUNTER — Inpatient Hospital Stay (HOSPITAL_COMMUNITY)
Admission: AD | Admit: 2021-06-25 | Discharge: 2021-06-29 | DRG: 787 | Disposition: A | Discharge status: Home / Self Care | Payer: BC Managed Care – PPO | Attending: Obstetrics & Gynecology | Admitting: Obstetrics & Gynecology

## 2021-06-25 ENCOUNTER — Other Ambulatory Visit: Payer: Self-pay

## 2021-06-25 DIAGNOSIS — Z87891 Personal history of nicotine dependence: Secondary | ICD-10-CM

## 2021-06-25 DIAGNOSIS — Z3A37 37 weeks gestation of pregnancy: Secondary | ICD-10-CM | POA: Diagnosis not present

## 2021-06-25 DIAGNOSIS — Z3490 Encounter for supervision of normal pregnancy, unspecified, unspecified trimester: Secondary | ICD-10-CM | POA: Diagnosis not present

## 2021-06-25 DIAGNOSIS — Z98891 History of uterine scar from previous surgery: Secondary | ICD-10-CM

## 2021-06-25 DIAGNOSIS — O134 Gestational [pregnancy-induced] hypertension without significant proteinuria, complicating childbirth: Principal | ICD-10-CM | POA: Diagnosis present

## 2021-06-25 DIAGNOSIS — O26893 Other specified pregnancy related conditions, third trimester: Secondary | ICD-10-CM | POA: Diagnosis present

## 2021-06-25 DIAGNOSIS — Z6791 Unspecified blood type, Rh negative: Secondary | ICD-10-CM | POA: Diagnosis not present

## 2021-06-25 DIAGNOSIS — Z20822 Contact with and (suspected) exposure to covid-19: Secondary | ICD-10-CM | POA: Diagnosis present

## 2021-06-25 DIAGNOSIS — O139 Gestational [pregnancy-induced] hypertension without significant proteinuria, unspecified trimester: Secondary | ICD-10-CM | POA: Diagnosis present

## 2021-06-25 DIAGNOSIS — O3663X0 Maternal care for excessive fetal growth, third trimester, not applicable or unspecified: Secondary | ICD-10-CM | POA: Diagnosis present

## 2021-06-25 DIAGNOSIS — D62 Acute posthemorrhagic anemia: Secondary | ICD-10-CM | POA: Diagnosis not present

## 2021-06-25 DIAGNOSIS — O9081 Anemia of the puerperium: Secondary | ICD-10-CM | POA: Diagnosis not present

## 2021-06-25 LAB — URINALYSIS, ROUTINE W REFLEX MICROSCOPIC
Bilirubin Urine: NEGATIVE
Glucose, UA: 50 mg/dL — AB
Hgb urine dipstick: NEGATIVE
Ketones, ur: NEGATIVE mg/dL
Leukocytes,Ua: NEGATIVE
Nitrite: NEGATIVE
Protein, ur: NEGATIVE mg/dL
Specific Gravity, Urine: 1.026 (ref 1.005–1.030)
pH: 6 (ref 5.0–8.0)

## 2021-06-25 LAB — CBC
HCT: 33.3 % — ABNORMAL LOW (ref 36.0–46.0)
Hemoglobin: 10.8 g/dL — ABNORMAL LOW (ref 12.0–15.0)
MCH: 28.2 pg (ref 26.0–34.0)
MCHC: 32.4 g/dL (ref 30.0–36.0)
MCV: 86.9 fL (ref 80.0–100.0)
Platelets: 293 10*3/uL (ref 150–400)
RBC: 3.83 MIL/uL — ABNORMAL LOW (ref 3.87–5.11)
RDW: 13.7 % (ref 11.5–15.5)
WBC: 12.9 10*3/uL — ABNORMAL HIGH (ref 4.0–10.5)
nRBC: 0 % (ref 0.0–0.2)

## 2021-06-25 LAB — COMPREHENSIVE METABOLIC PANEL
ALT: 19 U/L (ref 0–44)
AST: 19 U/L (ref 15–41)
Albumin: 2.6 g/dL — ABNORMAL LOW (ref 3.5–5.0)
Alkaline Phosphatase: 119 U/L (ref 38–126)
Anion gap: 6 (ref 5–15)
BUN: 16 mg/dL (ref 6–20)
CO2: 23 mmol/L (ref 22–32)
Calcium: 8.8 mg/dL — ABNORMAL LOW (ref 8.9–10.3)
Chloride: 107 mmol/L (ref 98–111)
Creatinine, Ser: 0.9 mg/dL (ref 0.44–1.00)
GFR, Estimated: >60 mL/min (ref >60)
Glucose, Bld: 118 mg/dL — ABNORMAL HIGH (ref 70–99)
Potassium: 4.2 mmol/L (ref 3.5–5.1)
Sodium: 136 mmol/L (ref 135–145)
Total Bilirubin: 0.4 mg/dL (ref 0.3–1.2)
Total Protein: 5.9 g/dL — ABNORMAL LOW (ref 6.5–8.1)

## 2021-06-25 LAB — CBC WITH DIFFERENTIAL/PLATELET
Abs Immature Granulocytes: 0.08 10*3/uL — ABNORMAL HIGH (ref 0.00–0.07)
Basophils Absolute: 0.1 10*3/uL (ref 0.0–0.1)
Basophils Relative: 1 %
Eosinophils Absolute: 0.1 10*3/uL (ref 0.0–0.5)
Eosinophils Relative: 1 %
HCT: 31.4 % — ABNORMAL LOW (ref 36.0–46.0)
Hemoglobin: 10.1 g/dL — ABNORMAL LOW (ref 12.0–15.0)
Immature Granulocytes: 1 %
Lymphocytes Relative: 13 %
Lymphs Abs: 1.6 10*3/uL (ref 0.7–4.0)
MCH: 27.7 pg (ref 26.0–34.0)
MCHC: 32.2 g/dL (ref 30.0–36.0)
MCV: 86.3 fL (ref 80.0–100.0)
Monocytes Absolute: 0.7 10*3/uL (ref 0.1–1.0)
Monocytes Relative: 5 %
Neutro Abs: 9.8 10*3/uL — ABNORMAL HIGH (ref 1.7–7.7)
Neutrophils Relative %: 79 %
Platelets: 286 10*3/uL (ref 150–400)
RBC: 3.64 MIL/uL — ABNORMAL LOW (ref 3.87–5.11)
RDW: 13.7 % (ref 11.5–15.5)
WBC: 12.3 10*3/uL — ABNORMAL HIGH (ref 4.0–10.5)
nRBC: 0 % (ref 0.0–0.2)

## 2021-06-25 LAB — LACTATE DEHYDROGENASE: LDH: 135 U/L (ref 98–192)

## 2021-06-25 LAB — TYPE AND SCREEN
ABO/RH(D): B NEG
Antibody Screen: POSITIVE

## 2021-06-25 LAB — RESP PANEL BY RT-PCR (FLU A&B, COVID) ARPGX2
Influenza A by PCR: NEGATIVE
Influenza B by PCR: NEGATIVE
SARS Coronavirus 2 by RT PCR: NEGATIVE

## 2021-06-25 LAB — PROTEIN / CREATININE RATIO, URINE
Creatinine, Urine: 148.05 mg/dL
Protein Creatinine Ratio: 0.22 mg/mg{Cre} — ABNORMAL HIGH (ref 0.00–0.15)
Total Protein, Urine: 32 mg/dL

## 2021-06-25 LAB — URIC ACID: Uric Acid, Serum: 5.6 mg/dL (ref 2.5–7.1)

## 2021-06-25 MED ORDER — OXYCODONE-ACETAMINOPHEN 5-325 MG PO TABS
1.0000 | ORAL_TABLET | ORAL | Status: DC | PRN
Start: 2021-06-25 — End: 2021-06-27

## 2021-06-25 MED ORDER — LACTATED RINGERS IV SOLN
500.0000 mL | Freq: Once | INTRAVENOUS | Status: DC
Start: 2021-06-25 — End: 2021-06-27

## 2021-06-25 MED ORDER — PHENYLEPHRINE 40 MCG/ML (10ML) SYRINGE FOR IV PUSH (FOR BLOOD PRESSURE SUPPORT)
80.0000 ug | PREFILLED_SYRINGE | INTRAVENOUS | Status: DC | PRN
  Filled 2021-06-25: qty 10

## 2021-06-25 MED ORDER — OXYCODONE-ACETAMINOPHEN 5-325 MG PO TABS
2.0000 | ORAL_TABLET | ORAL | Status: DC | PRN
Start: 2021-06-25 — End: 2021-06-27

## 2021-06-25 MED ORDER — EPHEDRINE 5 MG/ML INJ: 10.0000 mg | INTRAVENOUS | Status: DC | PRN

## 2021-06-25 MED ORDER — TERBUTALINE SULFATE 1 MG/ML IJ SOLN
0.2500 mg | Freq: Once | INTRAMUSCULAR | Status: AC | PRN
  Administered 2021-06-27: 0.25 mg via SUBCUTANEOUS
  Filled 2021-06-25: qty 1

## 2021-06-25 MED ORDER — DIPHENHYDRAMINE HCL 50 MG/ML IJ SOLN
12.5000 mg | INTRAMUSCULAR | Status: DC | PRN
Start: 2021-06-25 — End: 2021-06-27

## 2021-06-25 MED ORDER — LACTATED RINGERS IV SOLN: INTRAVENOUS | Status: DC

## 2021-06-25 MED ORDER — FENTANYL CITRATE (PF) 100 MCG/2ML IJ SOLN
50.0000 ug | INTRAMUSCULAR | Status: DC | PRN
  Administered 2021-06-26 (×2): 100 ug via INTRAVENOUS
  Filled 2021-06-25 (×2): qty 2

## 2021-06-25 MED ORDER — SOD CITRATE-CITRIC ACID 500-334 MG/5ML PO SOLN
30.0000 mL | ORAL | Status: DC | PRN
  Administered 2021-06-26 – 2021-06-27 (×2): 30 mL via ORAL
  Filled 2021-06-25 (×2): qty 30

## 2021-06-25 MED ORDER — OXYTOCIN-SODIUM CHLORIDE 30-0.9 UT/500ML-% IV SOLN: 2.5000 [IU]/h | INTRAVENOUS | Status: DC

## 2021-06-25 MED ORDER — OXYTOCIN BOLUS FROM INFUSION: 333.0000 mL | Freq: Once | INTRAVENOUS | Status: DC

## 2021-06-25 MED ORDER — LACTATED RINGERS IV SOLN
500.0000 mL | INTRAVENOUS | Status: DC | PRN
  Administered 2021-06-27: 500 mL via INTRAVENOUS

## 2021-06-25 MED ORDER — ONDANSETRON HCL 4 MG/2ML IJ SOLN
4.0000 mg | Freq: Four times a day (QID) | INTRAMUSCULAR | Status: DC | PRN
  Administered 2021-06-26: 4 mg via INTRAVENOUS
  Filled 2021-06-25: qty 2

## 2021-06-25 MED ORDER — ACETAMINOPHEN 325 MG PO TABS: 650.0000 mg | ORAL_TABLET | ORAL | Status: DC | PRN

## 2021-06-25 MED ORDER — PHENYLEPHRINE 40 MCG/ML (10ML) SYRINGE FOR IV PUSH (FOR BLOOD PRESSURE SUPPORT): 80.0000 ug | PREFILLED_SYRINGE | INTRAVENOUS | Status: DC | PRN

## 2021-06-25 MED ORDER — MISOPROSTOL 25 MCG QUARTER TABLET
25.0000 ug | ORAL_TABLET | ORAL | Status: DC | PRN
  Administered 2021-06-25 – 2021-06-26 (×2): 25 ug via VAGINAL
  Filled 2021-06-25 (×2): qty 1

## 2021-06-25 MED ORDER — FENTANYL-BUPIVACAINE-NACL 0.5-0.125-0.9 MG/250ML-% EP SOLN
12.0000 mL/h | EPIDURAL | Status: DC | PRN
  Administered 2021-06-26 – 2021-06-27 (×2): 12 mL/h via EPIDURAL
  Filled 2021-06-25 (×3): qty 250

## 2021-06-25 MED ORDER — LIDOCAINE HCL (PF) 1 % IJ SOLN
30.0000 mL | INTRAMUSCULAR | Status: DC | PRN
Start: 2021-06-25 — End: 2021-06-27

## 2021-06-25 NOTE — MAU Note (Signed)
Lisa Lewis is a 40 y.o. at [redacted]w[redacted]d here in MAU reporting: sent over from the office for HTN eval. BP was 150/90 and 160/90. Had a headache this AM and was relieved with tylenol. No visual changes. No RUQ pain. Denies pain currently. No bleeding or LOF. +FM  Onset of complaint: today  Pain score: 0/10  Vitals:   06/25/21 1835  BP: (!) 136/92  Pulse: 99  Resp: 18  Temp: 99 F (37.2 C)  SpO2: 99%     FHT:146  Lab orders placed from triage: entered by provider

## 2021-06-25 NOTE — H&P (Signed)
Lisa Lewis is a 40 y.o. female, G3P0020, IUP at 37.6 weeks, presenting for IOL for new onset GHTN, PCR 0.22, other labs unremarkable, asymptomatic currently. AMA (age 11, abnormal panoroma, had vanishing twin, no follow recorded), LGA 98% 8.15lbs on 7/28, bicuspid aortic valve (followed by DR Ledora Bottcher), IVF (donner egg), RH-, GBS-, h/o migraines no meds. Pt endorse + Fm. Denies vaginal leakage. Denies vaginal bleeding. Endorses mild cxt, irregular.   Patient Active Problem List   Diagnosis Date Noted   Bicuspid aortic valve 02/14/2018   Aortic root enlargement (HCC) 02/14/2018   Heart murmur 01/13/2018   Dyslipidemia 01/13/2018     Active Ambulatory Problems    Diagnosis Date Noted   Heart murmur 01/13/2018   Dyslipidemia 01/13/2018   Bicuspid aortic valve 02/14/2018   Aortic root enlargement (HCC) 02/14/2018   Resolved Ambulatory Problems    Diagnosis Date Noted   No Resolved Ambulatory Problems   Past Medical History:  Diagnosis Date   Acid reflux       Medications Prior to Admission  Medication Sig Dispense Refill Last Dose   omeprazole (PRILOSEC) 20 MG capsule Take 20 mg by mouth daily.   06/24/2021   Prenatal Vit-Fe Fumarate-FA (PRENATAL MULTIVITAMIN) TABS tablet Take 1 tablet by mouth daily at 12 noon.   06/24/2021    Past Medical History:  Diagnosis Date   Acid reflux    Bicuspid aortic valve      No current facility-administered medications on file prior to encounter.   Current Outpatient Medications on File Prior to Encounter  Medication Sig Dispense Refill   omeprazole (PRILOSEC) 20 MG capsule Take 20 mg by mouth daily.     Prenatal Vit-Fe Fumarate-FA (PRENATAL MULTIVITAMIN) TABS tablet Take 1 tablet by mouth daily at 12 noon.       No Known Allergies  History of present pregnancy: Pt Info/Preference:  Screening/Consents:  Labs:   EDD: Estimated Date of Delivery: 07/10/21  Establised: No LMP recorded. Patient is pregnant.  Anatomy Scan: Date:  02/19/2021 Placenta Location: anterior Genetic Screen: Panoroma:abnormal, NIPS not recommended due to twin at the time with donor egg.  AFP:  First Tri: Quad:  Office: [facility]            First PNV: 17.2 weeks Blood Type  B-  Language: english Last PNV: 37.6 weeks Rhogam  04/28/2021 given  Flu Vaccine:  declined   Antibody  Neg  TDaP vaccine UTD   GTT: Early: 5.4 Third Trimester: failed 1HGTT, passed 3HGTT  Feeding Plan: breast BTL: no Rubella:  Immune  Contraception: ??? VBAC: no RPR:   NR  Circumcision: ???   HBsAg:  Neg, HCV neg  Pediatrician:  ???   HIV:   Neg  Prenatal Classes: no Additional [place]: 7/28 [place] EFW 8.15lbs, vertex, anterior placenta, AFI 18.4, 98% in weight, BPP 8/8 GBS:  Negative (For PCN allergy, check sensitivities)       Chlamydia: neg    MFM Referral/Consult:  GC: neg  Support Person: husband   PAP: ???  Pain Management: epidural Neonatologist Referral:  Hgb Electrophoresis:  AA  Birth Plan: DCC   Hgb NOB: 12.9    28W: 11.4   OB History     Gravida  3   Para      Term      Preterm      AB  2   Living         SAB  1   IAB      Ectopic  1   Multiple      Live Births             Past Medical History:  Diagnosis Date   Acid reflux    Bicuspid aortic valve    Past Surgical History:  Procedure Laterality Date   DIAGNOSTIC LAPAROSCOPY WITH REMOVAL OF ECTOPIC PREGNANCY     Family History: Family history is unknown by patient. Social History:  reports that she has quit smoking. She has never used smokeless tobacco. She reports that she does not drink alcohol and does not use drugs.   Prenatal Transfer Tool  Maternal Diabetes: No Genetic Screening: Abnormal:  Results: Other:NIPS abnormal but pt using donor egg and had vanishing twins Maternal Ultrasounds/Referrals: Normal Fetal Ultrasounds or other Referrals:  None Maternal Substance Abuse:  No Significant Maternal Medications:  None Significant Maternal Lab Results: Group B Strep negative and  Rh negative  ROS:  Review of Systems  Constitutional: Negative.   HENT: Negative.    Eyes: Negative.   Respiratory: Negative.    Cardiovascular: Negative.   Gastrointestinal:  Positive for abdominal pain.  Genitourinary: Negative.   Musculoskeletal: Negative.   Skin: Negative.   Neurological: Negative.   Endo/Heme/Allergies: Negative.   Psychiatric/Behavioral: Negative.      Physical Exam: BP 133/80   Pulse (!) 105   Temp 99 F (37.2 C) (Oral)   Resp 18   Wt 123.5 kg   SpO2 98%   BMI 51.43 kg/m   Physical Exam Vitals and nursing note reviewed.  Constitutional:      Appearance: Normal appearance.  HENT:     Head: Normocephalic and atraumatic.     Nose: Nose normal.     Mouth/Throat:     Mouth: Mucous membranes are moist.  Eyes:     Conjunctiva/sclera: Conjunctivae normal.  Cardiovascular:     Rate and Rhythm: Normal rate and regular rhythm.     Pulses: Normal pulses.     Heart sounds: Normal heart sounds.  Pulmonary:     Effort: Pulmonary effort is normal.     Breath sounds: Normal breath sounds.  Abdominal:     General: Bowel sounds are normal.  Genitourinary:    Comments: Uterus gravida , pelvis adequate for vaginal delivery Musculoskeletal:        General: Normal range of motion.     Cervical back: Normal range of motion and neck supple.  Skin:    General: Skin is warm.     Capillary Refill: Capillary refill takes less than 2 seconds.  Neurological:     General: No focal deficit present.     Mental Status: She is alert.  Psychiatric:        Mood and Affect: Mood normal.     NST: FHR baseline 145 bpm, Variability: moderate, Accelerations:present, Decelerations:  Absent= Cat 1/Reactive UC:   irregular, every 3-6 minutes SVE: 1/60/-3   , vertex verified by fetal sutures.  Leopold's: Position vertex, EFW 7.5-8lbs via leopold's.   Labs: Results for orders placed or performed during the hospital encounter of 06/25/21 (from the past 24 hour(s))  CBC  with Differential/Platelet     Status: Abnormal   Collection Time: 06/25/21  6:16 PM  Result Value Ref Range   WBC 12.3 (H) 4.0 - 10.5 K/uL   RBC 3.64 (L) 3.87 - 5.11 MIL/uL   Hemoglobin 10.1 (L) 12.0 - 15.0 g/dL   HCT 00.8 (L) 67.6 - 19.5 %   MCV 86.3 80.0 - 100.0 fL  MCH 27.7 26.0 - 34.0 pg   MCHC 32.2 30.0 - 36.0 g/dL   RDW 76.1 60.7 - 37.1 %   Platelets 286 150 - 400 K/uL   nRBC 0.0 0.0 - 0.2 %   Neutrophils Relative % 79 %   Neutro Abs 9.8 (H) 1.7 - 7.7 K/uL   Lymphocytes Relative 13 %   Lymphs Abs 1.6 0.7 - 4.0 K/uL   Monocytes Relative 5 %   Monocytes Absolute 0.7 0.1 - 1.0 K/uL   Eosinophils Relative 1 %   Eosinophils Absolute 0.1 0.0 - 0.5 K/uL   Basophils Relative 1 %   Basophils Absolute 0.1 0.0 - 0.1 K/uL   Immature Granulocytes 1 %   Abs Immature Granulocytes 0.08 (H) 0.00 - 0.07 K/uL  Comprehensive metabolic panel     Status: Abnormal   Collection Time: 06/25/21  6:16 PM  Result Value Ref Range   Sodium 136 135 - 145 mmol/L   Potassium 4.2 3.5 - 5.1 mmol/L   Chloride 107 98 - 111 mmol/L   CO2 23 22 - 32 mmol/L   Glucose, Bld 118 (H) 70 - 99 mg/dL   BUN 16 6 - 20 mg/dL   Creatinine, Ser 0.62 0.44 - 1.00 mg/dL   Calcium 8.8 (L) 8.9 - 10.3 mg/dL   Total Protein 5.9 (L) 6.5 - 8.1 g/dL   Albumin 2.6 (L) 3.5 - 5.0 g/dL   AST 19 15 - 41 U/L   ALT 19 0 - 44 U/L   Alkaline Phosphatase 119 38 - 126 U/L   Total Bilirubin 0.4 0.3 - 1.2 mg/dL   GFR, Estimated >69 >48 mL/min   Anion gap 6 5 - 15  Lactate dehydrogenase     Status: None   Collection Time: 06/25/21  6:16 PM  Result Value Ref Range   LDH 135 98 - 192 U/L  Uric acid     Status: None   Collection Time: 06/25/21  6:16 PM  Result Value Ref Range   Uric Acid, Serum 5.6 2.5 - 7.1 mg/dL  Protein / creatinine ratio, urine     Status: Abnormal   Collection Time: 06/25/21  6:42 PM  Result Value Ref Range   Creatinine, Urine 148.05 mg/dL   Total Protein, Urine 32 mg/dL   Protein Creatinine Ratio 0.22 (H)  0.00 - 0.15 mg/mg[Cre]  Urinalysis, Routine w reflex microscopic Urine, Clean Catch     Status: Abnormal   Collection Time: 06/25/21  6:42 PM  Result Value Ref Range   Color, Urine YELLOW YELLOW   APPearance HAZY (A) CLEAR   Specific Gravity, Urine 1.026 1.005 - 1.030   pH 6.0 5.0 - 8.0   Glucose, UA 50 (A) NEGATIVE mg/dL   Hgb urine dipstick NEGATIVE NEGATIVE   Bilirubin Urine NEGATIVE NEGATIVE   Ketones, ur NEGATIVE NEGATIVE mg/dL   Protein, ur NEGATIVE NEGATIVE mg/dL   Nitrite NEGATIVE NEGATIVE   Leukocytes,Ua NEGATIVE NEGATIVE    Imaging:  No results found.  MAU Course: Orders Placed This Encounter  Procedures   CBC with Differential/Platelet   Comprehensive metabolic panel   Lactate dehydrogenase   Uric acid   Protein / creatinine ratio, urine   Urinalysis, Routine w reflex microscopic Urine, Clean Catch   Cervical Exam   No orders of the defined types were placed in this encounter.   Assessment/Plan: Lisa Lewis is a 40 y.o. female, G3P0020, IUP at 37.6 weeks, presenting for IOL for new onset GHTN, PCR 0.22, other labs unremarkable,  asymptomatic currently. AMA (age 40, abnormal panoroma, had vanishing twin, no follow recorded), LGA 98% 8.15lbs on 7/28, bicuspid aortic valve (followed by DR Ledora BottcherKrasowki), IVF (donner egg), RH-, GBS-, h/o migraines no meds. Pt endorse + Fm. Denies vaginal leakage. Denies vaginal bleeding. Endorses mild cxt, irregular.   FWB: Cat 1 Fetal Tracing.   I discussed with patient risks, benefits and alternatives of labor induction including higher risk of cesarean delivery compared to spontaneous labor.  We discussed risks of induction agents including effects on fetal heart beat, contraction pattern and need for close monitoring.  Patient expressed understanding of all this and desired to proceed with the induction. Risks and benefits of induction were reviewed, including failure of method, prolonged labor, need for further intervention, risk of  cesarean.  Patient and family verbalized understanding and denies any further questions at this time. Pt and family wish to proceed with induction process. Discussed induction options of Cytotec, foley bulb, AROM, and pitocin were reviewed as well as risks and benefits with use of each discussed.  Plan: Admit to [facility]thing [facility] per consult with DR Normand Sloopillard Routine [facility] orders GHTN: Monitor BP, repeat labs if BP >160/110 with labetalol protocol and magnesium if SR BP.  Pain med/epidural prn Cytotec for cervical ripening.  Anticipate labor progression  RH-: Rhogam PP if indicated  [facility] MeadeJade Zacari Stiff NP-C, CNM, MSN 06/25/2021, 8:23 PM

## 2021-06-26 ENCOUNTER — Inpatient Hospital Stay (HOSPITAL_COMMUNITY): Payer: BC Managed Care – PPO | Admitting: Anesthesiology

## 2021-06-26 DIAGNOSIS — Z3490 Encounter for supervision of normal pregnancy, unspecified, unspecified trimester: Secondary | ICD-10-CM | POA: Diagnosis not present

## 2021-06-26 LAB — CBC
HCT: 31.8 % — ABNORMAL LOW (ref 36.0–46.0)
Hemoglobin: 10.3 g/dL — ABNORMAL LOW (ref 12.0–15.0)
MCH: 27.7 pg (ref 26.0–34.0)
MCHC: 32.4 g/dL (ref 30.0–36.0)
MCV: 85.5 fL (ref 80.0–100.0)
Platelets: 257 10*3/uL (ref 150–400)
RBC: 3.72 MIL/uL — ABNORMAL LOW (ref 3.87–5.11)
RDW: 13.8 % (ref 11.5–15.5)
WBC: 14.9 10*3/uL — ABNORMAL HIGH (ref 4.0–10.5)
nRBC: 0 % (ref 0.0–0.2)

## 2021-06-26 LAB — RPR: RPR Ser Ql: NONREACTIVE

## 2021-06-26 MED ORDER — LIDOCAINE HCL (PF) 1 % IJ SOLN
INTRAMUSCULAR | Status: DC | PRN
  Administered 2021-06-26: 8 mL via EPIDURAL

## 2021-06-26 MED ORDER — OXYTOCIN-SODIUM CHLORIDE 30-0.9 UT/500ML-% IV SOLN
1.0000 m[IU]/min | INTRAVENOUS | Status: DC
  Administered 2021-06-26: 2 m[IU]/min via INTRAVENOUS
  Filled 2021-06-26: qty 500

## 2021-06-26 MED ORDER — TERBUTALINE SULFATE 1 MG/ML IJ SOLN: 0.2500 mg | Freq: Once | INTRAMUSCULAR | Status: DC | PRN

## 2021-06-26 NOTE — Progress Notes (Signed)
Lisa Progress Note  Lisa Lewis is a 40 y.o. female, G3P0020, IUP at 37.6 weeks, presenting for IOL for new onset GHTN on 7/28, today pt 38 weeks, admitting PCR 0.22, other labs unremarkable, asymptomatic currently. AMA (age 44, abnormal panoroma, had vanishing twin, no follow recorded), LGA 98% 8.15lbs on 7/28, bicuspid aortic valve (followed by DR Ledora Bottcher), IVF (donner egg), RH-, GBS-, h/o migraines no meds.  Subjective: Pt comfortable post epidural placement, pt stable, discussed AROM, R/B/A pt consent to AROM and pitocin.  Patient Active Problem List   Diagnosis Date Noted   Gestational hypertension 06/25/2021   Bicuspid aortic valve 02/14/2018   Aortic root enlargement (HCC) 02/14/2018   Heart murmur 01/13/2018   Dyslipidemia 01/13/2018   Objective: BP (!) 138/92   Pulse 95   Temp 98 F (36.7 C) (Oral)   Resp 16   Ht 5\' 1"  (1.549 m)   Wt 123.5 kg   SpO2 100%   BMI 51.44 kg/m  No intake/output data recorded. No intake/output data recorded. NST: FHR baseline 135 bpm, Variability: moderate, Accelerations:present, Decelerations:  Absent= Cat 1/Reactive CTX:  irregular, every 3-10 minutes Uterus gravid, soft non tender, moderate to palpate with contractions.  SVE:  Dilation: 4.5 Effacement (%): 70 Station: -2 Exam by:: J Kayron Hicklin CNM Pitocin at 2 mUn/min Foley bulb out.  AROM performed, moderate amount, clear, pt tolerated well.   Assessment:  Lisa Lewis is a 40 y.o. female, G3P0020, IUP at 37.6 weeks, presenting for IOL for new onset GHTN, PCR 0.22, other labs unremarkable, asymptomatic currently. AMA (age 23, abnormal panoroma, had vanishing twin, no follow recorded), LGA 98% 8.15lbs on 7/28, bicuspid aortic valve (followed by DR 8/28), IVF (donner egg), RH-, GBS-, h/o migraines no meds.Pt progressing in early Lisa of x2 Cytotec, foley out, progressing in latent Lisa post AROM and on pitocin.  Patient Active Problem List   Diagnosis Date Noted   Gestational  hypertension 06/25/2021   Bicuspid aortic valve 02/14/2018   Aortic root enlargement (HCC) 02/14/2018   Heart murmur 01/13/2018   Dyslipidemia 01/13/2018   NICHD: Category 1  Membranes:  AROM clear @ 1211 on 7/29, no s/s of infection  Induction:    Cytotec x@ 2305 on 7/28, 0314 on 7/29 Foley Bulb: Placed @ 0901 on 7/29, out @ 1130  Pitocin - 2  Pain management:   IV pain management: x fentanyl @ 0649, 0934 on 7/29  Nitrous: PRN Epidural placement: Placed 7/29 @ 1055  GBS Negative  GHTN: BP 138/92, PCR on 7/28 0.22, other labs unremarkable. Asymptomatic, stable, no medication indicated at this time.   Plan: Continue Lisa plan Continuous monitoring GHTN: Monitor BP, repeat labs if BP >160/110 with labetalol protocol and magnesium if SR BP.  Rest Frequent position changes to facilitate fetal rotation and descent. Will reassess with cervical exam at 4 or earlier if necessary Continue pitocin 2x2 to adequate cxt  Anticipate Lisa progression and vaginal delivery.  RH-: Rhogam PP if indicated.   8/28, NP-C, CNM, MSN 06/26/2021. 12:48 PM

## 2021-06-26 NOTE — Progress Notes (Signed)
Labor Progress Note  Lisa Lewis is a 40 y.o. female, G3P0020, IUP at 37.6 weeks, presenting for IOL for new onset GHTN on 7/28, today pt 38 weeks, admitting PCR 0.22, other labs unremarkable, asymptomatic currently. AMA (age 67, abnormal panoroma, had vanishing twin, no follow recorded), LGA 98% 8.15lbs on 7/28, bicuspid aortic valve (followed by DR Ledora Bottcher), IVF (donner egg), RH-, GBS-, h/o migraines no meds.  Subjective: Pt feeling cxt, requested epidural. RN drew CBC for epidural placement now. Discussed foley bulb placement R/B/A and pitocin, pt consent to placement.  Patient Active Problem List   Diagnosis Date Noted   Gestational hypertension 06/25/2021   Bicuspid aortic valve 02/14/2018   Aortic root enlargement (HCC) 02/14/2018   Heart murmur 01/13/2018   Dyslipidemia 01/13/2018   Objective: BP 135/87   Pulse 85   Temp 98 F (36.7 C) (Oral)   Resp 16   Ht 5\' 1"  (1.549 m)   Wt 123.5 kg   SpO2 99%   BMI 51.44 kg/m  No intake/output data recorded. No intake/output data recorded. NST: FHR baseline 135 bpm, Variability: moderate, Accelerations:present, Decelerations:  Absent= Cat 1/Reactive CTX:  irregular, every 3-10 minutes Uterus gravid, soft non tender, moderate to palpate with contractions.  SVE:  Dilation: 2 Effacement (%): 70 Station: -2 Exam by:: Abdulla Pooley, CNM Pitocin at (Plan to start) mUn/min Foley bulb placed with ease, internal balloon inflated to [phone] NS.   Assessment:  Lisa Lewis is a 40 y.o. female, G3P0020, IUP at 37.6 weeks, presenting for IOL for new onset GHTN, PCR 0.22, other labs unremarkable, asymptomatic currently. AMA (age 18, abnormal panoroma, had vanishing twin, no follow recorded), LGA 98% 8.15lbs on 7/28, bicuspid aortic valve (followed by DR 8/28), IVF (donner egg), RH-, GBS-, h/o migraines no meds.Pt progressing in early labor of x2 Cytotec, foley in place, will start pitocin.  Patient Active Problem List   Diagnosis Date Noted    Gestational hypertension 06/25/2021   Bicuspid aortic valve 02/14/2018   Aortic root enlargement (HCC) 02/14/2018   Heart murmur 01/13/2018   Dyslipidemia 01/13/2018   NICHD: Category 1  Membranes:  Intact, no s/s of infection  Induction:    Cytotec x@ 2305 on 7/28, 0314 on 7/29  Foley Bulb: Placed @ 0901 on 7/19  Pitocin - Plan to start now.   Pain management:   IV pain management: x fentanyl @ 0649 on 7/29  Nitrous: PRN Epidural placement: CBC draw for epidural placement.   GBS Negative  GHTN: BP 135/87, PCR on 7/28 0.22, other labs unremarkable. Asymptomatic, stable, no medication indicated at this time.   Plan: Continue labor plan Continuous monitoring GHTN: Monitor BP, repeat labs if BP >160/110 with labetalol protocol and magnesium if SR BP.  Rest Frequent position changes to facilitate fetal rotation and descent. Will reassess with cervical exam at 4 or earlier if necessary Start pitocin 2x2 to max of 6 until foley bulb out then may increase to cxt adequate. Anticipate AROM once foley bulb out  Anticipate pitocin and AROM once fetus descends.  Anticipate labor progression and vaginal delivery.  RH-: Rhogam PP if indicated.   8/28, NP-C, CNM, MSN 06/26/2021. 10:49 AM

## 2021-06-26 NOTE — Progress Notes (Signed)
Labor Progress Note  Lisa Lewis is a 40 y.o. female, G3P0020, IUP at 37.6 weeks, presenting for IOL for new onset GHTN on 7/28, today pt 38 weeks, admitting PCR 0.22, other labs unremarkable, asymptomatic currently. AMA (age 34, abnormal panoroma, had vanishing twin, no follow recorded), LGA 98% 8.15lbs on 7/28, bicuspid aortic valve (followed by DR Ledora Bottcher), IVF (donner egg), RH-, GBS-, h/o migraines no meds.  Subjective: Pt still comfortable discussed possible suspected CPD and shoulder dystocia if pt makes it to fully dilated, no cervical change noted over last 4 hours, cervical swelling noted with fetal caput, Discussed concern of CPD with DR Sallye Ober, DR Sallye Ober recommended continue with pitocin IOL unless fetal intolerance occurs. Pt aware and consented to continuing IOL, but pt ok to proceed with PCS if indicated.  Patient Active Problem List   Diagnosis Date Noted   Encounter for induction of labor 06/26/2021   SVD (spontaneous vaginal delivery) 06/26/2021   Normal postpartum course 06/26/2021   Gestational hypertension 06/25/2021   Bicuspid aortic valve 02/14/2018   Aortic root enlargement (HCC) 02/14/2018   Heart murmur 01/13/2018   Dyslipidemia 01/13/2018   Objective: BP 119/69   Pulse 92   Temp 99 F (37.2 C) (Axillary)   Resp 16   Ht 5\' 1"  (1.549 m)   Wt 123.5 kg   SpO2 100%   BMI 51.44 kg/m  No intake/output data recorded. No intake/output data recorded. NST: FHR baseline 140 bpm, Variability: moderate, Accelerations:present, Decelerations:  Absent= Cat 1/Reactive CTX:  irregular, coupling, every 1.5-4 minutes, lasting 70-90 seconds Uterus gravid, soft non tender, moderate to palpate with contractions.  SVE:  Dilation: 5 Effacement (%): 70 Station: -2 Exam by:: J Satcha Storlie CNM Pitocin at 12 mUn/min IUPC still in place, MVU 200-290s  Assessment:  Lisa Lewis is a 40 y.o. female, G3P0020, IUP at 37.6 weeks, presenting for IOL for new onset GHTN, PCR 0.22, other labs  unremarkable, asymptomatic currently. AMA (age 25, abnormal panoroma, had vanishing twin, no follow recorded), LGA 98% 8.15lbs on 7/28, bicuspid aortic valve (followed by DR 8/28), IVF (donner egg), RH-, GBS-, h/o migraines no meds. Pt progressing in early labor of x2 Cytotec, foley out, progressing in latent labor post AROM and on pitocin. Suspect slight concern for CPD, discussed with pt, IUPC in place, pt endorses she desires what if best for her and the baby no matter if that means a PCS.  Patient Active Problem List   Diagnosis Date Noted   Encounter for induction of labor 06/26/2021   SVD (spontaneous vaginal delivery) 06/26/2021   Normal postpartum course 06/26/2021   Gestational hypertension 06/25/2021   Bicuspid aortic valve 02/14/2018   Aortic root enlargement (HCC) 02/14/2018   Heart murmur 01/13/2018   Dyslipidemia 01/13/2018   NICHD: Category 1  Membranes:  AROM clear @ 1211 on 7/29, no s/s of infection  Asynclitic noted: attempted to lift fetus out of the pelvis when pt not cxt, then effleurage performed on maternal abdomen, pt and fetus tolerated well, pt left in right side with left leg high on peanut ball at 1632 on 7/29. At 1900 cervical swelling and fetal caput noted.   IUPC MVU 200-290s.  Induction:    Cytotec x@ 2305 on 7/28, 0314 on 7/29 Foley Bulb: Placed @ 0901 on 7/29, out @ 1130  Pitocin - 12  Pain management:   IV pain management: x fentanyl @ 0649, 0934 on 7/29  Nitrous: PRN Epidural placement: Placed 7/29 @ 1055  GBS Negative  GHTN: BP 139/80, PCR on 7/28 0.22, other labs unremarkable. Asymptomatic, stable, no medication indicated at this time.   Plan: Continue labor plan Continuous monitoring GHTN: Monitor BP, repeat labs if BP >160/110 with labetalol protocol and magnesium if SR BP.  Rest Frequent position changes to facilitate fetal rotation and descent. Will reassess with cervical exam at 6 or earlier if necessary Continue pitocin 2x2 to  adequate labor with MVU 200s.  Anticipate labor progression and vaginal delivery.  RH-: Rhogam PP if indicated.   [place], NP-C, CNM, MSN 06/26/2021. 7:08 PM

## 2021-06-26 NOTE — Progress Notes (Signed)
Labor Progress Note  Lisa Lewis is a 40 y.o. female, G3P0020, IUP at 37.6 weeks, presenting for IOL for new onset GHTN on 7/28, today pt 38 weeks, admitting PCR 0.22, other labs unremarkable, asymptomatic currently. AMA (age 24, abnormal panoroma, had vanishing twin, no follow recorded), LGA 98% 8.15lbs on 7/28, bicuspid aortic valve (followed by DR Ledora Bottcher), IVF (donner egg), RH-, GBS-, h/o migraines no meds.  Subjective: Pt still comfortable in bed in NAD. RN report and strip reviewed x1 prolonged decel at 1509 x5 mins prolonged decel to nadir of 90s, returned to baseline of 140 with maternal position change. Discussed R/B/A of IUPC, pt verbalized consent. Small Suspicion for CPD based of fetal leopold 9lbs and narrow pelvis. Patient Active Problem List   Diagnosis Date Noted   Encounter for induction of labor 06/26/2021   SVD (spontaneous vaginal delivery) 06/26/2021   Normal postpartum course 06/26/2021   Gestational hypertension 06/25/2021   Bicuspid aortic valve 02/14/2018   Aortic root enlargement (HCC) 02/14/2018   Heart murmur 01/13/2018   Dyslipidemia 01/13/2018   Objective: BP 139/80   Pulse 80   Temp 98.8 F (37.1 C) (Axillary)   Resp 16   Ht 5\' 1"  (1.549 m)   Wt 123.5 kg   SpO2 100%   BMI 51.44 kg/m  No intake/output data recorded. No intake/output data recorded. NST: FHR baseline 140 bpm, Variability: moderate, Accelerations:present, Decelerations:  Absent= Cat 1/Reactive CTX:  irregular, every 3 minutes, lasting 70-90 seconds Uterus gravid, soft non tender, moderate to palpate with contractions.  SVE:  Dilation: 4.5 Effacement (%): 70 Station: -2 Exam by:: J Breyanna Valera CNM Pitocin at 6 mUn/min IUPC placed with ease, MVU 200s  Assessment:  Lisa Lewis is a 40 y.o. female, G3P0020, IUP at 37.6 weeks, presenting for IOL for new onset GHTN, PCR 0.22, other labs unremarkable, asymptomatic currently. AMA (age 58, abnormal panoroma, had vanishing twin, no follow  recorded), LGA 98% 8.15lbs on 7/28, bicuspid aortic valve (followed by DR 8/28), IVF (donner egg), RH-, GBS-, h/o migraines no meds.Pt progressing in early labor of x2 Cytotec, foley out, progressing in latent labor post AROM and on pitocin. Suspect slight concern for CPD, discussed with pt, IUPC placed, will recheck in 2 hours if no cervical change will discuss PCS, pt endorses she desires what if best for her and the baby no matter if that means a PCS.  Patient Active Problem List   Diagnosis Date Noted   Encounter for induction of labor 06/26/2021   SVD (spontaneous vaginal delivery) 06/26/2021   Normal postpartum course 06/26/2021   Gestational hypertension 06/25/2021   Bicuspid aortic valve 02/14/2018   Aortic root enlargement (HCC) 02/14/2018   Heart murmur 01/13/2018   Dyslipidemia 01/13/2018   NICHD: Category 1  Membranes:  AROM clear @ 1211 on 7/29, no s/s of infection  Asynclitic noted: attempted to lift fetus out of the pelvis when pt not cxt, then effleurage performed on maternal abdomen, pt and fetus tolerated well, pt left in right side with left leg high on peanut ball.   IUPC MVU 200s.  Induction:    Cytotec x@ 2305 on 7/28, 0314 on 7/29 Foley Bulb: Placed @ 0901 on 7/29, out @ 1130  Pitocin - 2  Pain management:   IV pain management: x fentanyl @ 0649, 0934 on 7/29  Nitrous: PRN Epidural placement: Placed 7/29 @ 1055  GBS Negative  GHTN: BP 139/80, PCR on 7/28 0.22, other labs unremarkable. Asymptomatic, stable, no medication indicated at  this time.   Plan: Continue labor plan Continuous monitoring GHTN: Monitor BP, repeat labs if BP >160/110 with labetalol protocol and magnesium if SR BP.  Rest Frequent position changes to facilitate fetal rotation and descent. Will reassess with cervical exam at 2 or earlier if necessary Continue pitocin 2x2 to adequate labor with MVU 200s.  Anticipate labor progression and vaginal delivery.  RH-: Rhogam PP if  indicated.   Dale [place], NP-C, CNM, MSN 06/26/2021. 4:32 PM

## 2021-06-26 NOTE — Anesthesia Preprocedure Evaluation (Addendum)
Anesthesia Evaluation  Patient identified by MRN, date of birth, ID band Patient awake    Reviewed: Allergy & Precautions, NPO status , Patient's Chart, lab work & pertinent test results  Airway Mallampati: II  TM Distance: >3 FB Neck ROM: Full    Dental  (+) Teeth Intact   Pulmonary neg pulmonary ROS, former smoker,    Pulmonary exam normal        Cardiovascular hypertension, + Valvular Problems/Murmurs (bicuspid AV with minor root enlargement, followed by cardiology)  Rhythm:Regular Rate:Normal     Neuro/Psych negative neurological ROS  negative psych ROS   GI/Hepatic Neg liver ROS, GERD  Medicated,  Endo/Other  negative endocrine ROS  Renal/GU negative Renal ROS  negative genitourinary   Musculoskeletal negative musculoskeletal ROS (+)   Abdominal (+)  Abdomen: soft. Bowel sounds: normal.  Peds  Hematology negative hematology ROS (+)   Anesthesia Other Findings   Reproductive/Obstetrics (+) Pregnancy                             Anesthesia Physical Anesthesia Plan  ASA: 2  Anesthesia Plan: Epidural   Post-op Pain Management:    Induction:   PONV Risk Score and Plan: 2 and Treatment may vary due to age or medical condition  Airway Management Planned: Natural Airway  Additional Equipment: None  Intra-op Plan:   Post-operative Plan:   Informed Consent: I have reviewed the patients History and Physical, chart, labs and discussed the procedure including the risks, benefits and alternatives for the proposed anesthesia with the patient or authorized representative who has indicated his/her understanding and acceptance.     Dental advisory given  Plan Discussed with:   Anesthesia Plan Comments: (Lab Results      Component                Value               Date                      WBC                      14.9 (H)            06/26/2021                HGB                       10.3 (L)            06/26/2021                HCT                      31.8 (L)            06/26/2021                MCV                      85.5                06/26/2021                PLT                      257  06/26/2021           Lab Results      Component                Value               Date                      NA                       136                 06/25/2021                K                        4.2                 06/25/2021                CO2                      23                  06/25/2021                GLUCOSE                  118 (H)             06/25/2021                BUN                      16                  06/25/2021                CREATININE               0.90                06/25/2021                CALCIUM                  8.8 (L)             06/25/2021                GFRNONAA                 >60                 06/25/2021                GFRAA                    114                 02/20/2018          )        Anesthesia Quick Evaluation

## 2021-06-26 NOTE — Progress Notes (Signed)
Labor Progress Note  Lisa Lewis is a 40 y.o. female, G3P0020, IUP at 37.6 weeks, presenting for IOL for new onset GHTN on 7/28, today pt 38 weeks, admitting PCR 0.22, other labs unremarkable, asymptomatic currently. AMA (age 67, abnormal panoroma, had vanishing twin, no follow recorded), LGA 98% 8.15lbs on 7/28, bicuspid aortic valve (followed by DR Ledora Bottcher), IVF (donner egg), RH-, GBS-, h/o migraines no meds.  Subjective: RN reported pt was able to sleep some, pt stable in bed with husband supportive at bedside. Pt feeling cxt with cytotec.  Patient Active Problem List   Diagnosis Date Noted   Gestational hypertension 06/25/2021   Bicuspid aortic valve 02/14/2018   Aortic root enlargement (HCC) 02/14/2018   Heart murmur 01/13/2018   Dyslipidemia 01/13/2018   Objective: BP 132/85   Pulse 85   Temp 98 F (36.7 C) (Oral)   Resp 18   Ht 5\' 1"  (1.549 m)   Wt 123.5 kg   SpO2 99%   BMI 51.44 kg/m  No intake/output data recorded. No intake/output data recorded. NST: FHR baseline 135 bpm, Variability: moderate, Accelerations:present, Decelerations:  Absent= Cat 1/Reactive CTX:  irregular, every 3-10 minutes Uterus gravid, soft non tender, moderate to palpate with contractions.  SVE:  Dilation: 1.5 Effacement (%): 60 Station: -3 Exam by:: [email or web address] RN Pitocin at N/A mUn/min  Assessment:  Lisa Lewis is a 40 y.o. female, G3P0020, IUP at 37.6 weeks, presenting for IOL for new onset GHTN, PCR 0.22, other labs unremarkable, asymptomatic currently. AMA (age 71, abnormal panoroma, had vanishing twin, no follow recorded), LGA 98% 8.15lbs on 7/28, bicuspid aortic valve (followed by DR 8/28), IVF (donner egg), RH-, GBS-, h/o migraines no meds.Pt progressing in early labor.  Patient Active Problem List   Diagnosis Date Noted   Gestational hypertension 06/25/2021   Bicuspid aortic valve 02/14/2018   Aortic root enlargement (HCC) 02/14/2018   Heart murmur 01/13/2018    Dyslipidemia 01/13/2018   NICHD: Category 1  Membranes:  Intact, no s/s of infection  Induction:    Cytotec x@ 2305 on 7/28, 0314 on 7/29  Foley Bulb: Too posterior  Pitocin - On Cytotec.   Pain management:   IV pain management: x fentanyl @ 0649 on 7/29  Nitrous: PRN             Epidural placement: PRN  GBS Negative  GHTN: BP 128/76  Plan: Continue labor plan Continuous monitoring Rest Frequent position changes to facilitate fetal rotation and descent. Will reassess with cervical exam at 4 or earlier if necessary Continue cytotec per protocol Anticipate pitocin and AROM once fetus descends.  Anticipate labor progression and vaginal delivery.   8/29, NP-C, CNM, MSN 06/26/2021. 8:28 AM

## 2021-06-26 NOTE — Anesthesia Procedure Notes (Signed)
Epidural Patient location during procedure: OB Start time: 06/26/2021 11:05 AM End time: 06/26/2021 11:12 AM  Staffing Anesthesiologist: Atilano Median, DO Performed: anesthesiologist   Preanesthetic Checklist Completed: patient identified, IV checked, site marked, risks and benefits discussed, surgical consent, monitors and equipment checked, pre-op evaluation and timeout performed  Epidural Patient position: sitting Prep: ChloraPrep Patient monitoring: heart rate, continuous pulse ox and blood pressure Approach: midline Location: L3-L4 Injection technique: LOR saline  Needle:  Needle type: Tuohy  Needle gauge: 17 G Needle length: 9 cm Needle insertion depth: 8 cm Catheter type: closed end flexible Catheter size: 20 Guage Catheter at skin depth: 13 cm Test dose: negative and 1.5% lidocaine  Assessment Events: blood not aspirated, injection not painful, no injection resistance and no paresthesia  Additional Notes Patient identified. Risks/Benefits/Options discussed with patient including but not limited to bleeding, infection, nerve damage, paralysis, failed block, incomplete pain control, headache, blood pressure changes, nausea, vomiting, reactions to medications, itching and postpartum back pain. Confirmed with bedside nurse the patient's most recent platelet count. Confirmed with patient that they are not currently taking any anticoagulation, have any bleeding history or any family history of bleeding disorders. Patient expressed understanding and wished to proceed. All questions were answered. Sterile technique was used throughout the entire procedure. Please see nursing notes for vital signs. Test dose was given through epidural catheter and negative prior to continuing to dose epidural or start infusion. Warning signs of high block given to the patient including shortness of breath, tingling/numbness in hands, complete motor block, or any concerning symptoms with  instructions to call for help. Patient was given instructions on fall risk and not to get out of bed. All questions and concerns addressed with instructions to call with any issues or inadequate analgesia.    Reason for block:procedure for pain

## 2021-06-27 ENCOUNTER — Encounter (HOSPITAL_COMMUNITY)
Admission: AD | Disposition: A | Discharge status: Home / Self Care | Payer: Self-pay | Source: Home / Self Care | Attending: Obstetrics & Gynecology

## 2021-06-27 ENCOUNTER — Encounter (HOSPITAL_COMMUNITY): Payer: Self-pay | Admitting: Obstetrics & Gynecology

## 2021-06-27 DIAGNOSIS — D62 Acute posthemorrhagic anemia: Secondary | ICD-10-CM | POA: Diagnosis not present

## 2021-06-27 DIAGNOSIS — Z98891 History of uterine scar from previous surgery: Secondary | ICD-10-CM

## 2021-06-27 LAB — TYPE AND SCREEN
ABO/RH(D): B NEG
Antibody Screen: POSITIVE

## 2021-06-27 SURGERY — Surgical Case
Anesthesia: Epidural | Wound class: Clean Contaminated

## 2021-06-27 MED ORDER — LIDOCAINE-EPINEPHRINE (PF) 2 %-1:200000 IJ SOLN
INTRAMUSCULAR | Status: DC | PRN
  Administered 2021-06-27 (×3): 5 mL via INTRADERMAL

## 2021-06-27 MED ORDER — SODIUM CHLORIDE 0.9% FLUSH: 3.0000 mL | INTRAVENOUS | Status: DC | PRN

## 2021-06-27 MED ORDER — COCONUT OIL OIL: 1.0000 "application " | TOPICAL_OIL | Status: DC | PRN

## 2021-06-27 MED ORDER — SCOPOLAMINE 1 MG/3DAYS TD PT72: 1.0000 | MEDICATED_PATCH | Freq: Once | TRANSDERMAL | Status: DC

## 2021-06-27 MED ORDER — WITCH HAZEL-GLYCERIN EX PADS: 1.0000 "application " | MEDICATED_PAD | CUTANEOUS | Status: DC | PRN

## 2021-06-27 MED ORDER — OXYCODONE HCL 5 MG PO TABS
5.0000 mg | ORAL_TABLET | ORAL | Status: DC | PRN
  Administered 2021-06-28: 5 mg via ORAL
  Filled 2021-06-27: qty 1

## 2021-06-27 MED ORDER — DIPHENHYDRAMINE HCL 25 MG PO CAPS: 25.0000 mg | ORAL_CAPSULE | ORAL | Status: DC | PRN

## 2021-06-27 MED ORDER — ENOXAPARIN SODIUM 60 MG/0.6ML IJ SOSY
60.0000 mg | PREFILLED_SYRINGE | INTRAMUSCULAR | Status: DC
  Administered 2021-06-28 (×2): 60 mg via SUBCUTANEOUS
  Filled 2021-06-27 (×2): qty 0.6

## 2021-06-27 MED ORDER — DIPHENHYDRAMINE HCL 50 MG/ML IJ SOLN: 12.5000 mg | INTRAMUSCULAR | Status: DC | PRN

## 2021-06-27 MED ORDER — SIMETHICONE 80 MG PO CHEW: 80.0000 mg | CHEWABLE_TABLET | ORAL | Status: DC | PRN

## 2021-06-27 MED ORDER — ZOLPIDEM TARTRATE 5 MG PO TABS: 5.0000 mg | ORAL_TABLET | Freq: Every evening | ORAL | Status: DC | PRN

## 2021-06-27 MED ORDER — SOD CITRATE-CITRIC ACID 500-334 MG/5ML PO SOLN: 30.0000 mL | ORAL | Status: DC

## 2021-06-27 MED ORDER — FENTANYL CITRATE (PF) 100 MCG/2ML IJ SOLN
INTRAMUSCULAR | Status: AC
  Filled 2021-06-27: qty 2

## 2021-06-27 MED ORDER — NALOXONE HCL 4 MG/10ML IJ SOLN
1.0000 ug/kg/h | INTRAVENOUS | Status: DC | PRN
  Filled 2021-06-27: qty 5

## 2021-06-27 MED ORDER — ONDANSETRON HCL 4 MG/2ML IJ SOLN: 4.0000 mg | Freq: Once | INTRAMUSCULAR | Status: DC | PRN

## 2021-06-27 MED ORDER — NALOXONE HCL 0.4 MG/ML IJ SOLN: 0.4000 mg | INTRAMUSCULAR | Status: DC | PRN

## 2021-06-27 MED ORDER — MENTHOL 3 MG MT LOZG: 1.0000 | LOZENGE | OROMUCOSAL | Status: DC | PRN

## 2021-06-27 MED ORDER — MEPERIDINE HCL 25 MG/ML IJ SOLN
INTRAMUSCULAR | Status: AC
  Filled 2021-06-27: qty 1

## 2021-06-27 MED ORDER — ONDANSETRON HCL 4 MG/2ML IJ SOLN
INTRAMUSCULAR | Status: AC
  Filled 2021-06-27: qty 2

## 2021-06-27 MED ORDER — DIPHENHYDRAMINE HCL 50 MG/ML IJ SOLN
INTRAMUSCULAR | Status: DC | PRN
  Administered 2021-06-27: 25 mg via INTRAVENOUS

## 2021-06-27 MED ORDER — STERILE WATER FOR IRRIGATION IR SOLN
Status: DC | PRN
  Administered 2021-06-27: 1

## 2021-06-27 MED ORDER — KETOROLAC TROMETHAMINE 30 MG/ML IJ SOLN
INTRAMUSCULAR | Status: AC
  Filled 2021-06-27: qty 1

## 2021-06-27 MED ORDER — DIBUCAINE (PERIANAL) 1 % EX OINT: 1.0000 "application " | TOPICAL_OINTMENT | CUTANEOUS | Status: DC | PRN

## 2021-06-27 MED ORDER — DEXAMETHASONE SODIUM PHOSPHATE 4 MG/ML IJ SOLN
INTRAMUSCULAR | Status: DC | PRN
  Administered 2021-06-27: 4 mg via INTRAVENOUS

## 2021-06-27 MED ORDER — NALBUPHINE HCL 10 MG/ML IJ SOLN
5.0000 mg | Freq: Once | INTRAMUSCULAR | Status: DC | PRN
Start: 2021-06-27 — End: 2021-06-29

## 2021-06-27 MED ORDER — TRANEXAMIC ACID-NACL 1000-0.7 MG/100ML-% IV SOLN
INTRAVENOUS | Status: AC
  Filled 2021-06-27: qty 100

## 2021-06-27 MED ORDER — SIMETHICONE 80 MG PO CHEW
80.0000 mg | CHEWABLE_TABLET | Freq: Three times a day (TID) | ORAL | Status: DC
  Administered 2021-06-28 – 2021-06-29 (×3): 80 mg via ORAL
  Filled 2021-06-27 (×4): qty 1

## 2021-06-27 MED ORDER — PRENATAL MULTIVITAMIN CH
1.0000 | ORAL_TABLET | Freq: Every day | ORAL | Status: DC
  Administered 2021-06-28: 1 via ORAL
  Filled 2021-06-27: qty 1

## 2021-06-27 MED ORDER — SODIUM CHLORIDE 0.9 % IV SOLN: INTRAVENOUS | Status: DC | PRN

## 2021-06-27 MED ORDER — KETOROLAC TROMETHAMINE 30 MG/ML IJ SOLN
30.0000 mg | Freq: Four times a day (QID) | INTRAMUSCULAR | Status: AC
  Administered 2021-06-27 – 2021-06-28 (×3): 30 mg via INTRAVENOUS
  Filled 2021-06-27 (×3): qty 1

## 2021-06-27 MED ORDER — DEXTROSE 5 % IV SOLN
INTRAVENOUS | Status: DC | PRN
  Administered 2021-06-27: 3 g via INTRAVENOUS

## 2021-06-27 MED ORDER — SCOPOLAMINE 1 MG/3DAYS TD PT72
MEDICATED_PATCH | TRANSDERMAL | Status: DC | PRN
  Administered 2021-06-27: 1 via TRANSDERMAL

## 2021-06-27 MED ORDER — TETANUS-DIPHTH-ACELL PERTUSSIS 5-2.5-18.5 LF-MCG/0.5 IM SUSY: 0.5000 mL | PREFILLED_SYRINGE | Freq: Once | INTRAMUSCULAR | Status: DC

## 2021-06-27 MED ORDER — DEXAMETHASONE SODIUM PHOSPHATE 4 MG/ML IJ SOLN
INTRAMUSCULAR | Status: AC
  Filled 2021-06-27: qty 1

## 2021-06-27 MED ORDER — OXYTOCIN-SODIUM CHLORIDE 30-0.9 UT/500ML-% IV SOLN: 2.5000 [IU]/h | INTRAVENOUS | Status: AC

## 2021-06-27 MED ORDER — TRANEXAMIC ACID-NACL 1000-0.7 MG/100ML-% IV SOLN
INTRAVENOUS | Status: DC | PRN
  Administered 2021-06-27: 1000 mg via INTRAVENOUS

## 2021-06-27 MED ORDER — DIPHENHYDRAMINE HCL 25 MG PO CAPS: 25.0000 mg | ORAL_CAPSULE | Freq: Four times a day (QID) | ORAL | Status: DC | PRN

## 2021-06-27 MED ORDER — NALBUPHINE HCL 10 MG/ML IJ SOLN: 5.0000 mg | INTRAMUSCULAR | Status: DC | PRN

## 2021-06-27 MED ORDER — PHENYLEPHRINE HCL (PRESSORS) 10 MG/ML IV SOLN
INTRAVENOUS | Status: DC | PRN
  Administered 2021-06-27 (×2): 120 ug via INTRAVENOUS

## 2021-06-27 MED ORDER — PHENYLEPHRINE 40 MCG/ML (10ML) SYRINGE FOR IV PUSH (FOR BLOOD PRESSURE SUPPORT)
PREFILLED_SYRINGE | INTRAVENOUS | Status: AC
  Filled 2021-06-27: qty 10

## 2021-06-27 MED ORDER — ONDANSETRON HCL 4 MG/2ML IJ SOLN
INTRAMUSCULAR | Status: DC | PRN
  Administered 2021-06-27: 4 mg via INTRAVENOUS

## 2021-06-27 MED ORDER — OXYTOCIN-SODIUM CHLORIDE 30-0.9 UT/500ML-% IV SOLN
INTRAVENOUS | Status: AC
  Filled 2021-06-27: qty 500

## 2021-06-27 MED ORDER — LACTATED RINGERS IV SOLN: INTRAVENOUS | Status: DC

## 2021-06-27 MED ORDER — FENTANYL CITRATE (PF) 100 MCG/2ML IJ SOLN
INTRAMUSCULAR | Status: DC | PRN
  Administered 2021-06-27: 100 ug via EPIDURAL

## 2021-06-27 MED ORDER — MEPERIDINE HCL 25 MG/ML IJ SOLN
INTRAMUSCULAR | Status: DC | PRN
  Administered 2021-06-27 (×2): 12.5 mg via INTRAVENOUS

## 2021-06-27 MED ORDER — KETOROLAC TROMETHAMINE 30 MG/ML IJ SOLN
30.0000 mg | Freq: Once | INTRAMUSCULAR | Status: AC | PRN
Start: 2021-06-27 — End: 2021-06-27
  Administered 2021-06-27: 30 mg via INTRAVENOUS

## 2021-06-27 MED ORDER — OXYTOCIN-SODIUM CHLORIDE 30-0.9 UT/500ML-% IV SOLN
INTRAVENOUS | Status: DC | PRN
  Administered 2021-06-27: 30 [IU] via INTRAVENOUS

## 2021-06-27 MED ORDER — MORPHINE SULFATE (PF) 0.5 MG/ML IJ SOLN
INTRAMUSCULAR | Status: AC
  Filled 2021-06-27: qty 10

## 2021-06-27 MED ORDER — MEPERIDINE HCL 25 MG/ML IJ SOLN
6.2500 mg | INTRAMUSCULAR | Status: DC | PRN
  Administered 2021-06-27: 6.25 mg via INTRAVENOUS

## 2021-06-27 MED ORDER — ONDANSETRON HCL 4 MG/2ML IJ SOLN: 4.0000 mg | Freq: Three times a day (TID) | INTRAMUSCULAR | Status: DC | PRN

## 2021-06-27 MED ORDER — IBUPROFEN 600 MG PO TABS
600.0000 mg | ORAL_TABLET | Freq: Four times a day (QID) | ORAL | Status: DC
  Administered 2021-06-28 – 2021-06-29 (×4): 600 mg via ORAL
  Filled 2021-06-27 (×4): qty 1

## 2021-06-27 MED ORDER — SENNOSIDES-DOCUSATE SODIUM 8.6-50 MG PO TABS
2.0000 | ORAL_TABLET | Freq: Every day | ORAL | Status: DC
  Administered 2021-06-28 – 2021-06-29 (×2): 2 via ORAL
  Filled 2021-06-27 (×2): qty 2

## 2021-06-27 MED ORDER — NALBUPHINE HCL 10 MG/ML IJ SOLN
5.0000 mg | INTRAMUSCULAR | Status: DC | PRN
Start: 2021-06-27 — End: 2021-06-29

## 2021-06-27 MED ORDER — NALBUPHINE HCL 10 MG/ML IJ SOLN: 5.0000 mg | Freq: Once | INTRAMUSCULAR | Status: DC | PRN

## 2021-06-27 MED ORDER — SODIUM CHLORIDE 0.9 % IR SOLN
Status: DC | PRN
  Administered 2021-06-27: 1

## 2021-06-27 MED ORDER — SCOPOLAMINE 1 MG/3DAYS TD PT72
MEDICATED_PATCH | TRANSDERMAL | Status: AC
  Filled 2021-06-27: qty 1

## 2021-06-27 SURGICAL SUPPLY — 39 items
BENZOIN TINCTURE PRP APPL 2/3 (GAUZE/BANDAGES/DRESSINGS) ×2 IMPLANT
CHLORAPREP W/TINT 26ML (MISCELLANEOUS) ×2 IMPLANT
CLAMP CORD UMBIL (MISCELLANEOUS) IMPLANT
CLOSURE STERI-STRIP 1/4X4 (GAUZE/BANDAGES/DRESSINGS) ×2 IMPLANT
CLOTH BEACON ORANGE TIMEOUT ST (SAFETY) ×2 IMPLANT
DERMABOND ADVANCED (GAUZE/BANDAGES/DRESSINGS)
DERMABOND ADVANCED .7 DNX12 (GAUZE/BANDAGES/DRESSINGS) IMPLANT
DRAPE C SECTION CLR SCREEN (DRAPES) ×2 IMPLANT
DRSG OPSITE POSTOP 4X10 (GAUZE/BANDAGES/DRESSINGS) ×2 IMPLANT
ELECT REM PT RETURN 9FT ADLT (ELECTROSURGICAL) ×2
ELECTRODE REM PT RTRN 9FT ADLT (ELECTROSURGICAL) ×1 IMPLANT
EXTRACTOR VACUUM M CUP 4 TUBE (SUCTIONS) IMPLANT
GLOVE BIOGEL PI IND STRL 7.0 (GLOVE) ×2 IMPLANT
GLOVE BIOGEL PI INDICATOR 7.0 (GLOVE) ×2
GLOVE SURG SS PI 6.5 STRL IVOR (GLOVE) ×2 IMPLANT
GOWN STRL REUS W/TWL LRG LVL3 (GOWN DISPOSABLE) ×4 IMPLANT
HEMOSTAT ARISTA ABSORB 3G PWDR (HEMOSTASIS) ×2 IMPLANT
HOVERMATT SINGLE USE (MISCELLANEOUS) ×2 IMPLANT
KIT ABG SYR 3ML LUER SLIP (SYRINGE) IMPLANT
NEEDLE HYPO 25X5/8 SAFETYGLIDE (NEEDLE) IMPLANT
NS IRRIG 1000ML POUR BTL (IV SOLUTION) ×2 IMPLANT
PACK C SECTION WH (CUSTOM PROCEDURE TRAY) ×2 IMPLANT
PAD OB MATERNITY 4.3X12.25 (PERSONAL CARE ITEMS) ×2 IMPLANT
PENCIL SMOKE EVAC W/HOLSTER (ELECTROSURGICAL) ×2 IMPLANT
RTRCTR C-SECT PINK 25CM LRG (MISCELLANEOUS) ×2 IMPLANT
SUT CHROMIC 1 CTX 36 (SUTURE) IMPLANT
SUT CHROMIC 2 0 CT 1 (SUTURE) ×2 IMPLANT
SUT MON AB 4-0 PS1 27 (SUTURE) ×2 IMPLANT
SUT PLAIN 1 NONE 54 (SUTURE) IMPLANT
SUT PLAIN 2 0 (SUTURE)
SUT PLAIN 2 0 XLH (SUTURE) IMPLANT
SUT PLAIN ABS 2-0 CT1 27XMFL (SUTURE) IMPLANT
SUT VIC AB 0 CTX 36 (SUTURE) ×1
SUT VIC AB 0 CTX36XBRD ANBCTRL (SUTURE) ×1 IMPLANT
SUT VIC AB 1 CTX 36 (SUTURE) ×2
SUT VIC AB 1 CTX36XBRD ANBCTRL (SUTURE) ×2 IMPLANT
TOWEL OR 17X24 6PK STRL BLUE (TOWEL DISPOSABLE) ×2 IMPLANT
TRAY FOLEY W/BAG SLVR 14FR LF (SET/KITS/TRAYS/PACK) ×2 IMPLANT
WATER STERILE IRR 1000ML POUR (IV SOLUTION) ×2 IMPLANT

## 2021-06-27 NOTE — Op Note (Signed)
Cesarean Section Procedure Note  Indications: P0 at 38 1/7wks undergoing IOL d/t GHTN with fetal intolerance of labor  Pre-operative Diagnosis: 1.38 1/7wks 2.GHTN 3.IOL 4.Fetal Intolerance of Labor 5.LGA   Post-operative Diagnosis: 1.38 1/7wks 2.GHTN 3.IOL 4.Fetal Intolerance of Labor 5.LGA 6.DOP  Procedure: Primary Low Transverse Cesarean Section  Surgeon: Hoover Browns, MD  (started the case) Osborn Coho, MD  Assistants: Rhea Pink, CNM  Anesthesia: Regional  Anesthesiologist: See Flow Sheet   Procedure Details  I relieved Dr. Sallye Ober (please see her note).  The uterine incision was closed with running interlocking sutures of 0 Vicryl and a second imbricating layer was performed as well.   Bilateral tubes and ovaries appeared to be within normal limits.  Good hemostasis was noted.  Copious irrigation was performed until clear.  The peritoneum was repaired with 2-0 chromic via a running suture.  The fascia was reapproximated with a running suture of 0 Vicryl. The subcutaneous tissue was reapproximated with 3 interrupted sutures of 2-0 plain.  The skin was reapproximated with a subcuticular suture of 3-0 monocryl.  Steristrips were applied.  Instrument, sponge, and needle counts were correct prior to abdominal closure and at the conclusion of the case.  The patient was awaiting transfer to the recovery room in good condition.  Findings: Live female infant with Apgars 8 at one minute and 9 at five minutes.  Normal appearing bilateral ovaries and fallopian tubes were noted.  Estimated Blood Loss:  521 ml         Drains: foley to gravity 25 cc         Total IV Fluids: 1100 ml         Specimens to Pathology: Placenta per Dr. Sallye Ober         Complications:  None; patient tolerated the procedure well.         Disposition: PACU - hemodynamically stable.         Condition: stable  Attending Attestation: I performed the procedure.  I was present and scrubbed and the assistant was  required due to complexity of anatomy.

## 2021-06-27 NOTE — Op Note (Addendum)
Patient: Lisa Lewis DOB: 01-29-1981 MRN:  [id number]  DATE OF SURGERY: 06/27/2021  PREOP DIAGNOSIS:  1. 38 week 1 day EGA Intrauterine pregnancy. 2. Induction of labor for gestational hypertension. 3. Abnormal fetal heart tracing with baseline in 150s, absent variability and recurrent variable decelerations in the second stage of labor while pushing.   4. Large for gestational age with EFW of 8lb 15 oz (98th%) 5. IVF pregnancy 6. Maternal BMI of 51  POSTOP DIAGNOSIS: Same as above.  PROCEDURE: Urgent primary low uterine segment transverse cesarean section via Pfannenstiel incision.     SURGEON: Dr.  Hoover Browns then Dr. Osborn Coho  ASSISTANT: Rhea Pink, CNM  ATTENDING ATTESTATION: I was present and scrubbed and performed the procedure and the assistant was required due to the complexity of anatomy.   ANESTHESIA: Bolused epidural  COMPLICATIONS: None  FINDINGS: Viable female infant in cephalic presentation, DOP, with nuchal cord, weight pending, Apgar scores of  8 and 9. Normal uterus and fallopian tubes and ovaries bilaterally.    EBL:  289.   IV FLUID:  600 cc LR   URINE OUTPUT: not assessed before I left OR  INDICATIONS:  40 y/o G1P0 who presented for induction of labor for gestational HTN.  She received cytotec, AROM and pitocin for induction and got to full dilation and pushing stage. She pushed for about 2 hours with minimal descent of the fetus and abnormal fetal heart tracing noted.  She was not a candidate for vacuum assisted delivery due to LGA and high fetal station.  She was consented for a cesarean section after explaining risks benefits and alternatives of the procedure including risks of heavy bleeding, infection, damage to organs and possible placenta problems in subsequent pregnancies.    PROCEDURE:  She was taken to the operating room where her spinal anesthesia was found to be adequate. She was prepped and draped in the usual sterile fashion and a Foley  catheter was placed. She received 3 g of IV Ancef preoperatively. A Pfannenstiel incision was made with the scalpel and the incision extended through the subcutaneous layer and also the fascia with the bovie. Small perforators in the subcutaneous layer were contained with the Bovie. The fascia was nicked in the midline and then was further separated from the rectus muscles bilaterally using Mayo scissors. Kochers were placed inferiorly and then superiorly to allow further separation of fascia from the rectus muscles.  The peritoneal cavity was entered bluntly with the fingers. The Alexis retractor was placed in. The bladder flap was created using Metzenbaum scissors.   The uterus was incised with a scalpel and the incision extended bluntly bilaterally with fingers and bandage scisors. Membranes were ruptured and moderate clear amniotic fluid was noted.  The head then the rest of the body was then delivered with abdominal pressure.  She delivered a viable female infant, apgar scores 9, 9.  The cord was clamped and cut after 1 minute. Cord blood was collected.    The uterus was not exteriorized.  The placenta was delivered with gentle traction on the umbilical cord and cleaving it off the anterior wall. The edges of the uterus was grasped with Allis clamps. The uterus was cleared of clots and debris with a lap.  At this point Dr. Su Hilt took over the surgery and will be doing her dictation for her part of the surgery.  SPECIMEN: Placenta to pathology, umbilical cord blood to lab.    Dr. Sallye Ober.   06/27/21. 4098 am.

## 2021-06-27 NOTE — Lactation Note (Signed)
This note was copied from a baby's chart. Lactation Consultation Note  Patient Name: Lisa Lewis OVFIE'P Date: 06/27/2021 Reason for consult: Initial assessment;Mother's request;Difficult latch;Primapara;1st time breastfeeding;Early term 39-38.6wks;Other (Comment) (GHTN, IVF breast changes during preg increase in size but not leakage of colostrum) Age:39 hours  LC assisted latching infant in football or laid back but nipples are short and breasts are widely spaced infant not able to sustain the latch. LC tried hand expression but no drops of colostrum noted.  LC used 20 NS following attempts at breast and suck training to bring his tongue down and flange out his lips. Infant able to latch with help of 20 NS and increase depth of swallows noted with breast compression and drops of colostrum noted inside NS.   LC did talk to parents offering DBM as an option for supplementation if needed.   Infant at end of visit, feeding better with increase depth of swallows independent of breast compression.   Plan 1. To feed based on cues 8-12x in 24 hr period no more than 4 hrs without an attempt. Mom to offer  both breasts STS and look for signs of milk transfer. Mom pre pump with manual for 5-10 min to elongate nipple before latching. Mom to try tea cup hold if unable to sustain the latch use 20 NS.  2. Mom to offer EBM via spoon if unable to get infant to latch.  3. DEBP q 3 hrs for 15 min 4. I and O sheet reviewed.  5. LC brochure of inpatient and outpatient services reviewed.  All questions answered at the end of the visit.    Maternal Data Has patient been taught Hand Expression?: Yes Does the patient have breastfeeding experience prior to this delivery?: No  Feeding Mother's Current Feeding Choice: Breast Milk  LATCH Score Latch: Repeated attempts needed to sustain latch, nipple held in mouth throughout feeding, stimulation needed to elicit sucking reflex.  Audible Swallowing: A few with  stimulation  Type of Nipple: Flat  Comfort (Breast/Nipple): Soft / non-tender  Hold (Positioning): Assistance needed to correctly position infant at breast and maintain latch.  LATCH Score: 6   Lactation Tools Discussed/Used Tools: Pump;Flanges;Nipple Shields Nipple shield size: 20 (Nipples are flat and breasts are widely spaced. LC attempted latching in football and laid back but nipples short infant not able to sustain it even with a tea cup hold.) Flange Size: 21 Breast pump type: Double-Electric Breast Pump Pump Education: Setup, frequency, and cleaning;Milk Storage Reason for Pumping: increase stimulation Pumping frequency: every 3 hrs for 15 min  Interventions Interventions: Breast feeding basics reviewed;Support pillows;Education;Assisted with latch;Position options;Skin to skin;Expressed milk;Breast massage;Hand express;DEBP;Breast compression;Adjust position;Pre-pump if needed  Discharge Pump: Personal WIC Program: No  Consult Status Consult Status: Follow-up Date: 06/28/21 Follow-up type: In-patient    Reizy Dunlow  Nicholson-Springer 06/27/2021, 4:32 PM

## 2021-06-27 NOTE — Progress Notes (Signed)
Labor Progress Note  Lisa Lewis is a 40 y.o. female, G3P0020, IUP at 37.6 weeks, presenting for IOL for new onset GHTN on 7/28, today pt 38 weeks, admitting PCR 0.22, other labs unremarkable, asymptomatic currently. AMA (age 32, abnormal panoroma, had vanishing twin, no follow recorded), LGA 98% 8.15lbs on 7/28, bicuspid aortic valve (followed by DR Ledora Bottcher), IVF (donner egg), RH-, GBS-, h/o migraines no meds.  Subjective: Pt still comfortable , RN report variables noted, maternal position change helps, returns to baseline, variables to 90s. Cat 2.  Patient Active Problem List   Diagnosis Date Noted   Encounter for induction of labor 06/26/2021   SVD (spontaneous vaginal delivery) 06/26/2021   Normal postpartum course 06/26/2021   Gestational hypertension 06/25/2021   Bicuspid aortic valve 02/14/2018   Aortic root enlargement (HCC) 02/14/2018   Heart murmur 01/13/2018   Dyslipidemia 01/13/2018   Objective: BP (!) 150/83   Pulse 96   Temp 99.2 F (37.3 C) (Axillary)   Resp 18   Ht 5\' 1"  (1.549 m)   Wt 123.5 kg   SpO2 100%   BMI 51.44 kg/m  No intake/output data recorded. Total I/O In: -  Out: 450 [Urine:450] NST: FHR baseline 140 bpm, Variability: moderate, Accelerations:present, Decelerations:  Absent= Cat 1/Reactive CTX:  irregular, coupling, every 2-5 minutes, lasting 70-90 seconds Uterus gravid, soft non tender, moderate to palpate with contractions.  SVE:  Dilation: 6 Effacement (%): 80 Station: -2 Exam by:: Davina, RN Pitocin at 16 mUn/min IUPC still in place, MVU 220  Assessment:  Lisa Lewis is a 40 y.o. female, G3P0020, IUP at 37.6 weeks, presenting for IOL for new onset GHTN, PCR 0.22, other labs unremarkable, asymptomatic currently. AMA (age 54, abnormal panoroma, had vanishing twin, no follow recorded), LGA 98% 8.15lbs on 7/28, bicuspid aortic valve (followed by DR 8/28), IVF (donner egg), RH-, GBS-, h/o migraines no meds. Pt progressing in early labor of  x2 Cytotec, foley out, progressing in active labor Patient Active Problem List   Diagnosis Date Noted   Encounter for induction of labor 06/26/2021   SVD (spontaneous vaginal delivery) 06/26/2021   Normal postpartum course 06/26/2021   Gestational hypertension 06/25/2021   Bicuspid aortic valve 02/14/2018   Aortic root enlargement (HCC) 02/14/2018   Heart murmur 01/13/2018   Dyslipidemia 01/13/2018   NICHD: Category 2 Nadir 90s, repetitive variables, return to baseline with fluid bolus and maternal position change.   Membranes:  AROM clear @ 1211 on 7/29, no s/s of infection  Asynclitic noted: attempted to lift fetus out of the pelvis when pt not cxt, then effleurage performed on maternal abdomen, pt and fetus tolerated well, pt left in right side with left leg high on peanut ball at 1632 on 7/29. At 1900 on cervical swelling and fetal caput noted.   IUPC MVU 220s.  Induction:    Cytotec x@ 2305 on 7/28, 0314 on 7/29 Foley Bulb: Placed @ 0901 on 7/29, out @ 1130  Pitocin - 16  Pain management:   IV pain management: x fentanyl @ 0649, 0934 on 7/29  Nitrous: PRN Epidural placement: Placed 7/29 @ 1055  GBS Negative  GHTN: BP 144/86, PCR on 7/28 0.22, other labs unremarkable. Asymptomatic, stable, no medication indicated at this time.   Plan: Continue labor plan Continuous monitoring GHTN: Monitor BP, repeat labs if BP >160/110 with labetalol protocol and magnesium if SR BP.  Rest Frequent position changes to facilitate fetal rotation and descent. Will reassess with cervical exam at 6 or  earlier if necessary Continue pitocin 2x2 to adequate labor with MVU 200s.  Anticipate labor progression and vaginal delivery.  RH-: Rhogam PP if indicated.   Dale [place], NP-C, CNM, MSN 06/27/2021. 5:36 AM

## 2021-06-27 NOTE — Progress Notes (Signed)
Labor Progress Note  Lisa Lewis is a 40 y.o. female, G3P0020, IUP at 37.6 weeks, presenting for IOL for new onset GHTN on 7/28, today pt 38 weeks, admitting PCR 0.22, other labs unremarkable, asymptomatic currently. AMA (age 63, abnormal panoroma, had vanishing twin, no follow recorded), LGA 98% 8.15lbs on 7/28, bicuspid aortic valve (followed by DR Ledora Bottcher), IVF (donner egg), RH-, GBS-, h/o migraines no meds.  Subjective: Pt feeling pressure and desire to push, found to be complete, variables noted, maternal position change helps, returns to baseline, variables to 90s. Cat 2.  Patient Active Problem List   Diagnosis Date Noted   Encounter for induction of labor 06/26/2021   SVD (spontaneous vaginal delivery) 06/26/2021   Normal postpartum course 06/26/2021   Gestational hypertension 06/25/2021   Bicuspid aortic valve 02/14/2018   Aortic root enlargement (HCC) 02/14/2018   Heart murmur 01/13/2018   Dyslipidemia 01/13/2018   Objective: BP (!) 150/83   Pulse 96   Temp 99.2 F (37.3 C) (Axillary)   Resp 18   Ht 5\' 1"  (1.549 m)   Wt 123.5 kg   SpO2 100%   BMI 51.44 kg/m  No intake/output data recorded. Total I/O In: -  Out: 450 [Urine:450] NST: FHR baseline 140 bpm, Variability: moderate, Accelerations:present, Decelerations:  variables noted= Cat 2/Reactive CTX:  irregular, coupling, every 2-4 minutes, lasting 70-90 seconds Uterus gravid, soft non tender, moderate to palpate with contractions.  SVE:  Dilation: 10 Effacement (%): 100 Station: +1 with caput Exam by:: J. Colonel Krauser, CNM Pitocin at 18 mUn/min IUPC still in place, MVU 220  Assessment:  Lisa Lewis is a 40 y.o. female, G3P0020, IUP at 37.6 weeks, presenting for IOL for new onset GHTN, PCR 0.22, other labs unremarkable, asymptomatic currently. AMA (age 85, abnormal panoroma, had vanishing twin, no follow recorded), LGA 98% 8.15lbs on 7/28, bicuspid aortic valve (followed by DR 8/28), IVF (donner egg), RH-, GBS-,  h/o migraines no meds. Pt progressing in early labor of x2 Cytotec, foley out, fully dilated and pushing, varibles with pushing resolve post pushing to nadir 90s. DR Ledora Bottcher consulted and updated.  Patient Active Problem List   Diagnosis Date Noted   Encounter for induction of labor 06/26/2021   SVD (spontaneous vaginal delivery) 06/26/2021   Normal postpartum course 06/26/2021   Gestational hypertension 06/25/2021   Bicuspid aortic valve 02/14/2018   Aortic root enlargement (HCC) 02/14/2018   Heart murmur 01/13/2018   Dyslipidemia 01/13/2018   NICHD: Category 2 Nadir 90s, repetitive variables, return to baseline with fluid bolus and maternal position change.   Membranes:  AROM clear @ 1211 on 7/29, no s/s of infection  Asynclitic noted: attempted to lift fetus out of the pelvis when pt not cxt, then effleurage performed on maternal abdomen, pt and fetus tolerated well, pt left in right side with left leg high on peanut ball at 1632 on 7/29. At 1900 on 7/29 cervical swelling and fetal caput noted.   IUPC MVU 220s.  Induction:    Cytotec x@ 2305 on 7/28, 0314 on 7/29 Foley Bulb: Placed @ 0901 on 7/29, out @ 1130  Pitocin - 18  Pain management:   IV pain management: x fentanyl @ 0649, 0934 on 7/29  Nitrous: PRN Epidural placement: Placed 7/29 @ 1055  GBS Negative  GHTN: BP 150/83, PCR on 7/28 0.22, other labs unremarkable. Asymptomatic, stable, no medication indicated at this time.   Plan: Continue labor plan Continuous monitoring GHTN: Monitor BP, repeat labs if BP >160/110 with labetalol  protocol and magnesium if SR BP.  Pushing started per maternal urge  Continue pitocin 2x2 to adequate labor with MVU 200s.  Anticipate labor progression and vaginal delivery.  RH-: Rhogam PP if indicated.   Dale [place], NP-C, CNM, MSN 06/27/2021. 5:44 AM

## 2021-06-27 NOTE — Transfer of Care (Signed)
Immediate Anesthesia Transfer of Care Note  Patient: Lisa Lewis  Procedure(s) Performed: CESAREAN SECTION  Patient Location: PACU  Anesthesia Type:Epidural  Level of Consciousness: awake, alert  and oriented  Airway & Oxygen Therapy: Patient Spontanous Breathing  Post-op Assessment: Report given to RN and Post -op Vital signs reviewed and stable  Post vital signs: Reviewed and stable  Last Vitals:  Vitals Value Taken Time  BP 189/175 06/27/21 0906  Temp    Pulse 104 06/27/21 0908  Resp 19 06/27/21 0908  SpO2 100 % 06/27/21 0908  Vitals shown include unvalidated device data.  Last Pain:  Vitals:   06/27/21 0526  TempSrc: Axillary  PainSc:       Patients Stated Pain Goal: 0 (06/26/21 5427)  Complications: No notable events documented.

## 2021-06-28 ENCOUNTER — Encounter (HOSPITAL_COMMUNITY): Payer: Self-pay | Admitting: Obstetrics & Gynecology

## 2021-06-28 LAB — CBC
HCT: 26.4 % — ABNORMAL LOW (ref 36.0–46.0)
Hemoglobin: 8.4 g/dL — ABNORMAL LOW (ref 12.0–15.0)
MCH: 27.6 pg (ref 26.0–34.0)
MCHC: 31.8 g/dL (ref 30.0–36.0)
MCV: 86.8 fL (ref 80.0–100.0)
Platelets: 302 10*3/uL (ref 150–400)
RBC: 3.04 MIL/uL — ABNORMAL LOW (ref 3.87–5.11)
RDW: 14.2 % (ref 11.5–15.5)
WBC: 22.5 10*3/uL — ABNORMAL HIGH (ref 4.0–10.5)
nRBC: 0 % (ref 0.0–0.2)

## 2021-06-28 MED ORDER — POLYSACCHARIDE IRON COMPLEX 150 MG PO CAPS
150.0000 mg | ORAL_CAPSULE | Freq: Every day | ORAL | Status: DC
  Administered 2021-06-28 – 2021-06-29 (×2): 150 mg via ORAL
  Filled 2021-06-28 (×2): qty 1

## 2021-06-28 MED ORDER — ACETAMINOPHEN 500 MG PO TABS: 1000.0000 mg | ORAL_TABLET | Freq: Four times a day (QID) | ORAL | Status: DC | PRN

## 2021-06-28 MED ORDER — RHO D IMMUNE GLOBULIN 1500 UNIT/2ML IJ SOSY
300.0000 ug | PREFILLED_SYRINGE | Freq: Once | INTRAMUSCULAR | Status: AC
  Administered 2021-06-28: 300 ug via INTRAVENOUS
  Filled 2021-06-28: qty 2

## 2021-06-28 MED ORDER — ACETAMINOPHEN 500 MG PO TABS
1000.0000 mg | ORAL_TABLET | Freq: Four times a day (QID) | ORAL | Status: DC
  Administered 2021-06-28 – 2021-06-29 (×4): 1000 mg via ORAL
  Filled 2021-06-28 (×4): qty 2

## 2021-06-28 NOTE — Progress Notes (Signed)
Parent request formula to supplement breast feeding due to worry about baby not getting enough intake volume and latching issues. Parents have been informed of small tummy size of newborn, taught hand expression and understands the possible consequences of formula to the health of the infant. The possible consequences shared with patent include 1) Loss of confidence in breastfeeding 2) Engorgement 3) Allergic sensitization of baby (asthma/allergies) and 4) decreased milk supply for mother. After discussion of the above the mother decided to supplement with Sim 360. The  tool used to give formula supplement will be a bottle and an extra slow flow nipple.

## 2021-06-28 NOTE — Lactation Note (Signed)
This note was copied from a baby's chart. Lactation Consultation Note  Patient Name: Boy Lively Haberman [id number] Date: 06/28/2021 Reason for consult: Follow-up assessment Age:39 hours  LC in to room for follow up. Mother states she is having nipple pain when using DEBP. LC checked flanges, 21-mm seems to be the most comfortable one. Noted bruise and broken skin on left nipple. Brought coconut oil and encouraged applying to flanges prior to pumping and to nipples for lubrication.  Mother reports no latching infant for >12 h since formula was requested. Encouraged to continue pumping for stimulation. Infant brought back to room from circumcision during this visit.   Feeding Mother's Current Feeding Choice: Breast Milk and Formula  Lactation Tools Discussed/Used Tools: Pump;Flanges;Nipple Shields Nipple shield size: 20 Flange Size: 21;24 Breast pump type: Manual;Double-Electric Breast Pump Reason for Pumping: stimulation and supplementation Pumping frequency: after feedings  Interventions Interventions: DEBP;Hand pump;Expressed milk;Education  Discharge Discharge Education: Engorgement and breast care Pump: Personal;Manual;DEBP  Consult Status Consult Status: Follow-up Date: 06/29/21 Follow-up type: In-patient    Akaiya Touchette A Higuera Ancidey 06/28/2021, 5:00 PM

## 2021-06-28 NOTE — Progress Notes (Addendum)
Subjective: POD# 1 Information for the patient's newborn:  Nyasha, Rahilly [[id number]]  female   Baby Boy Circumcision desires in pt   Reports feeling "Good, just a little pain" Feeding: breast and bottle Reports tolerating PO and denies N/V, foley removed, ambulating and urinating w/o difficulty  Pain controlled with acetaminophen and ibuprofen (OTC) Denies HA/SOB/dizziness  Flatus negative Vaginal bleeding is normal, no clots     Objective:  VS:  Vitals:   06/27/21 1645 06/27/21 2150 06/28/21 0051 06/28/21 0519  BP: 140/69 134/77 122/81 122/72  Pulse: 82 87 90 79  Resp: 20 18 18 16   Temp: 98.4 F (36.9 C) 98.6 F (37 C) 98.7 F (37.1 C) 98 F (36.7 C)  TempSrc:      SpO2: 96% 97% 97% 97%  Weight:      Height:        Intake/Output Summary (Last 24 hours) at 06/28/2021 0826 Last data filed at 06/28/2021 06/30/2021 Gross per 24 hour  Intake 1101.63 ml  Output 1856 ml  Net -754.37 ml     Recent Labs    06/26/21 1009 06/28/21 0418  WBC 14.9* 22.5*  HGB 10.3* 8.4*  HCT 31.8* 26.4*  PLT 257 302    Blood type: --/--/B NEG (07/30 1154) Rubella: Immune (02/01 0000)    Physical Exam:  General: alert, cooperative, and no distress CV: Regular rate and rhythm Resp: clear Abdomen: soft, nontender, normal bowel sounds Incision: clean, dry, and intact Perineum:  Uterine Fundus: firm, below umbilicus, nontender Lochia: minimal Ext: extremities normal, atraumatic, no cyanosis or edema and Homans sign is negative, no sign of DVT   Assessment/Plan: 40 y.o.   POD# 1. 20                  Principal Problem:   S/P primary low transverse cesarean section Active Problems:   Gestational hypertension   Encounter for induction of labor   Routine post-op PP care          Advance diet as tolerated Advised warm fluids and ambulation to improve GI motility PO iron supplementation (declined IV iron) Encourage rest when baby rests Breastfeeding support Anticipate D/C  06/30/21 Dr 08/30/21 aware of pt status and POC  Sallye Ober, MSN, CNM 06/28/2021, 8:26 AM MD Addendum.   I saw and examined patient at bedside and agree with above findings, assessment and plan as outlined above by 06/30/2021, MSN, CNM.  Patient desires circumcision of her baby and was consented for the procedure for her son.  With 2+ edema on feet, management discussed, c/w expectant management for now.  Dr. Carollee Leitz.  06/28/2021.

## 2021-06-29 LAB — RH IG WORKUP (INCLUDES ABO/RH)
Fetal Screen: NEGATIVE
Gestational Age(Wks): 38.1
Unit division: 0

## 2021-06-29 MED ORDER — POLYSACCHARIDE IRON COMPLEX 150 MG PO CAPS: 150.0000 mg | ORAL_CAPSULE | Freq: Every day | ORAL | 2 refills | Status: AC

## 2021-06-29 MED ORDER — NIFEDIPINE ER 30 MG PO TB24: 30.0000 mg | ORAL_TABLET | Freq: Every day | ORAL | 2 refills | Status: AC

## 2021-06-29 MED ORDER — IBUPROFEN 600 MG PO TABS: 600.0000 mg | ORAL_TABLET | Freq: Four times a day (QID) | ORAL | 0 refills | Status: AC

## 2021-06-29 MED ORDER — NIFEDIPINE ER OSMOTIC RELEASE 30 MG PO TB24
30.0000 mg | ORAL_TABLET | Freq: Every day | ORAL | Status: DC
  Administered 2021-06-29: 30 mg via ORAL
  Filled 2021-06-29: qty 1

## 2021-06-29 MED ORDER — ACETAMINOPHEN 500 MG PO TABS: 1000.0000 mg | ORAL_TABLET | Freq: Four times a day (QID) | ORAL | 0 refills | Status: AC

## 2021-06-29 MED ORDER — OXYCODONE HCL 5 MG PO TABS: 5.0000 mg | ORAL_TABLET | ORAL | 0 refills | Status: AC | PRN

## 2021-06-29 NOTE — Discharge Summary (Addendum)
Postpartum Discharge Summary  Date of Service updated 06/29/21    Patient Name: Lisa Lewis DOB: February 22, 1981 MRN: [id number]  Date of admission: 06/25/2021 Delivery date:06/27/2021  Delivering provider: Waymon Amato  Date of discharge: 06/29/2021  Admitting diagnosis: Gestational hypertension [O13.9] Intrauterine pregnancy: [redacted]w[redacted]d     Secondary diagnosis:  Principal Problem:   S/P primary low transverse cesarean section Active Problems:   Gestational hypertension   Encounter for induction of labor   Acute blood loss anemia (ABLA)  Additional problems: none    Discharge diagnosis: Term Pregnancy Delivered, Gestational Hypertension, and Anemia                                              Post partum procedures: none Augmentation: AROM, Pitocin, Cytotec, and IP Foley Complications: None  Hospital course: Induction of Labor With Cesarean Section   40 y.o. yo [id number] at [redacted]w[redacted]d was admitted to the hospital 06/25/2021 for induction of labor. Patient had a labor course significant for protracted second stage. The patient went for cesarean section due to Non-Reassuring FHR. Delivery details are as follows: Membrane Rupture Time/Date: 12:11 PM ,06/26/2021   Delivery Method:C-Section, Low Transverse  Details of operation can be found in separate operative Note.  Patient had labile blood pressures during the postpartum course and is being discharged on Procardia XL 30 mg daily. Pt was offered IV iron for her hemoglobin of 8.4 and she declined. She is asymptomatic for anemia and is ambulating, tolerating a regular diet, passing flatus, and urinating well.  Patient is discharged home in stable condition on 06/29/21.      Newborn Data: Birth date:06/27/2021  Birth time:7:46 AM  Gender:Female  Living status:Living  Apgars:8 ,9  Weight:3260 g                                Magnesium Sulfate received: No BMZ received: No Rhophylac:N/A MMR:N/A Transfusion:No  Physical exam  Vitals:   06/28/21  0519 06/28/21 1434 06/28/21 2150 06/29/21 0602  BP: 122/72 130/81 129/60 140/89  Pulse: 79 81 87 89  Resp: $Remo'16 18 16 16  'Dtdwu$ Temp: 98 F (36.7 C) 97.8 F (36.6 C) 97.8 F (36.6 C) 97.7 F (36.5 C)  TempSrc:  Oral Oral Oral  SpO2: 97% 96% 100% 99%  Weight:      Height:       General: alert, cooperative, and no distress Lochia: appropriate Uterine Fundus: firm Incision: Dressing is clean, dry, and intact DVT Evaluation: No evidence of DVT seen on physical exam. No cords or calf tenderness. Calf/Ankle edema is present Labs: Lab Results  Component Value Date   WBC 22.5 (H) 06/28/2021   HGB 8.4 (L) 06/28/2021   HCT 26.4 (L) 06/28/2021   MCV 86.8 06/28/2021   PLT 302 06/28/2021   CMP Latest Ref Rng & Units 06/25/2021  Glucose 70 - 99 mg/dL 118(H)  BUN 6 - 20 mg/dL 16  Creatinine 0.44 - 1.00 mg/dL 0.90  Sodium 135 - 145 mmol/L 136  Potassium 3.5 - 5.1 mmol/L 4.2  Chloride 98 - 111 mmol/L 107  CO2 22 - 32 mmol/L 23  Calcium 8.9 - 10.3 mg/dL 8.8(L)  Total Protein 6.5 - 8.1 g/dL 5.9(L)  Total Bilirubin 0.3 - 1.2 mg/dL 0.4  Alkaline Phos 38 - 126 U/L 119  AST 15 - 41 U/L 19  ALT 0 - 44 U/L 19   Edinburgh Score: No flowsheet data found.    After visit meds:  Allergies as of 06/29/2021   No Known Allergies      Medication List     TAKE these medications    acetaminophen 500 MG tablet Commonly known as: TYLENOL Take 2 tablets (1,000 mg total) by mouth every 6 (six) hours.   ibuprofen 600 MG tablet Commonly known as: ADVIL Take 1 tablet (600 mg total) by mouth every 6 (six) hours.   iron polysaccharides 150 MG capsule Commonly known as: NIFEREX Take 1 capsule (150 mg total) by mouth daily.   NIFEdipine 30 MG 24 hr tablet Commonly known as: ADALAT CC Take 1 tablet (30 mg total) by mouth daily.   omeprazole 20 MG capsule Commonly known as: PRILOSEC Take 20 mg by mouth daily.   oxyCODONE 5 MG immediate release tablet Commonly known as: Oxy IR/ROXICODONE Take 1  tablet (5 mg total) by mouth every 4 (four) hours as needed for up to 5 days for moderate pain or severe pain.   prenatal multivitamin Tabs tablet Take 1 tablet by mouth daily at 12 noon.         Discharge home in stable condition Infant Feeding: Bottle and Breast Infant Disposition:home with mother Discharge instruction: per After Visit Summary and Postpartum booklet. Activity: Advance as tolerated. Pelvic rest for 6 weeks.  Diet: low salt diet Anticipated Birth Control:  female infertility Postpartum Appointment:6 weeks Additional Postpartum F/U: BP check 1 week Future Appointments:No future appointments. Follow up Visit:  Follow-up Information     Everett Graff, MD. Go in 1 week(s).   Specialty: Obstetrics and Gynecology Why: [facility] to [facility] in 1 week for BP check and then in 6 weeks for regular postpartum visit. Contact information: [address] [place] [address] [phone]                     06/29/2021 Arrie Eastern, CNM

## 2021-06-29 NOTE — Lactation Note (Signed)
This note was copied from a baby's chart. Lactation Consultation Note  Patient Name: Lisa Lewis [id number] Date: 06/29/2021 Reason for consult: Follow-up assessment;Early term 37-38.6wks;1st time breastfeeding;Primapara Age:39 hours   P1 mother whose infant is now 62 hours old.  This is an ETI at 38+1 weeks.  Mother is breast feeding and supplementing with formula.  Mother is "taking a break" from latching.  She chose to supplement with formula since yesterday evening.  She has not been pumping.  Educated mother on the importance of pumping when baby is not latching to help ensure a good milk supply.  Mother unaware of the importance of this.  Mother verbalized her possible desire to pump and bottle feed rather than latching to the breast.  Acknowledged her preference and just reiterated the importance of pumping every three hours when he feeds.  Mother verbalized understanding.  Mother has a DEBP for home use.  She has our OP phone number for any further questions/concerns.  Family looking forward to discharge today.   Maternal Data Has patient been taught Hand Expression?: Yes Does the patient have breastfeeding experience prior to this delivery?: No  Feeding Mother's Current Feeding Choice: Breast Milk and Formula  LATCH Score                    Lactation Tools Discussed/Used Nipple shield size: 20 Flange Size: 21;24 Breast pump type: Double-Electric Breast Pump;Manual Reason for Pumping: Breast stimulation and supplementation for ETI Pumping frequency: Every three hours  Interventions    Discharge Discharge Education: Engorgement and breast care Pump: Personal;Manual;DEBP WIC Program: No  Consult Status Consult Status: Complete Date: 06/29/21 Follow-up type: In-patient    Mckayla Mulcahey R Madelaine Whipple 06/29/2021, 10:12 AM

## 2021-06-29 NOTE — Anesthesia Postprocedure Evaluation (Signed)
Anesthesia Post Note  Patient: Lisa Lewis  Procedure(s) Performed: CESAREAN SECTION     Patient location during evaluation: Mother Baby Anesthesia Type: Epidural Level of consciousness: oriented and awake and alert Pain management: pain level controlled Vital Signs Assessment: post-procedure vital signs reviewed and stable Respiratory status: spontaneous breathing and respiratory function stable Cardiovascular status: blood pressure returned to baseline and stable Postop Assessment: no headache, no backache, no apparent nausea or vomiting and able to ambulate Anesthetic complications: no   No notable events documented.  Last Vitals:  Vitals:   06/28/21 2150 06/29/21 0602  BP: 129/60 140/89  Pulse: 87 89  Resp: 16 16  Temp: 36.6 C 36.5 C  SpO2: 100% 99%    Last Pain:  Vitals:   06/29/21 0900  TempSrc:   PainSc: 0-No pain                 Earl Lites P Dyshawn Cangelosi

## 2021-06-30 LAB — SURGICAL PATHOLOGY
